# Patient Record
Sex: Female | Born: 1965
Health system: Southern US, Community
[De-identification: ages and names within clinical notes are randomized; demographics above are authoritative.]

## PROBLEM LIST (undated history)

## (undated) DIAGNOSIS — D649 Anemia, unspecified: Secondary | ICD-10-CM

## (undated) DIAGNOSIS — Z8619 Personal history of other infectious and parasitic diseases: Secondary | ICD-10-CM

## (undated) HISTORY — DX: Anemia, unspecified: D64.9

## (undated) HISTORY — DX: Personal history of other infectious and parasitic diseases: Z86.19

---

## 1985-07-22 HISTORY — PX: DILATION AND CURETTAGE OF UTERUS: SHX78

## 1993-07-22 HISTORY — PX: OTHER SURGICAL HISTORY: SHX169

## 1998-08-11 ENCOUNTER — Ambulatory Visit (HOSPITAL_COMMUNITY): Admission: RE | Admit: 1998-08-11 | Discharge: 1998-08-11 | Payer: Self-pay | Admitting: General Surgery

## 1998-08-11 ENCOUNTER — Encounter: Payer: Self-pay | Admitting: General Surgery

## 2000-01-10 ENCOUNTER — Other Ambulatory Visit: Admission: RE | Admit: 2000-01-10 | Discharge: 2000-01-10 | Payer: Self-pay | Admitting: Obstetrics & Gynecology

## 2008-05-11 ENCOUNTER — Ambulatory Visit (HOSPITAL_BASED_OUTPATIENT_CLINIC_OR_DEPARTMENT_OTHER): Admission: RE | Admit: 2008-05-11 | Discharge: 2008-05-11 | Payer: Self-pay | Admitting: Nephrology

## 2008-05-14 ENCOUNTER — Ambulatory Visit: Payer: Self-pay | Admitting: Internal Medicine

## 2010-04-25 ENCOUNTER — Emergency Department (HOSPITAL_COMMUNITY): Admission: EM | Admit: 2010-04-25 | Discharge: 2010-04-25 | Payer: Self-pay | Admitting: Emergency Medicine

## 2010-10-04 LAB — POCT I-STAT, CHEM 8
BUN: 14 mg/dL (ref 6–23)
Calcium, Ion: 1.11 mmol/L — ABNORMAL LOW (ref 1.12–1.32)
Chloride: 107 mEq/L (ref 96–112)
Creatinine, Ser: 0.8 mg/dL (ref 0.4–1.2)
Glucose, Bld: 92 mg/dL (ref 70–99)
HCT: 33 % — ABNORMAL LOW (ref 36.0–46.0)
Hemoglobin: 11.2 g/dL — ABNORMAL LOW (ref 12.0–15.0)
Potassium: 3.9 mEq/L (ref 3.5–5.1)
Sodium: 139 mEq/L (ref 135–145)
TCO2: 23 mmol/L (ref 0–100)

## 2010-10-04 LAB — POCT CARDIAC MARKERS
CKMB, poc: <1 ng/mL — ABNORMAL LOW (ref 1.0–8.0)
Myoglobin, poc: 63.7 ng/mL (ref 12–200)
Troponin i, poc: <0.05 ng/mL (ref 0.00–0.09)

## 2010-12-04 NOTE — Procedures (Signed)
[name], Lorraine NO.:  [id number]   MEDICAL RECORD NO.:  [id number]          PATIENT TYPE:  OUT   LOCATION:  SLEEP CENTER                 FACILITY:  [facility]   PHYSICIAN:  Clinton D. Maple Hudson, MD, FCCP, FACPDATE OF BIRTH:  09-May-1966   DATE OF STUDY:  05/11/2008                            NOCTURNAL POLYSOMNOGRAM   REFERRING PHYSICIAN:  Jarome Matin, M.D.   INDICATION FOR STUDY:  Hypersomnia with sleep apnea.   EPWORTH SLEEPINESS SCORE:  Epworth sleepiness score 15/24.  BMI 36.5.  Weight 240 pounds.  Height 68 inches.  Neck 16 inches.   HOME MEDICATIONS:  None reported.   SLEEP ARCHITECTURE:  Total sleep time 413 minutes with sleep efficiency  91.9%.  Stage I was 4%.  Stage II 74.6%.  Stage III absent.  REM 21.4%  of total sleep time.  Sleep latency 9 minutes.  REM latency 98 minutes.  Awake after sleep onset 27 minutes.  Arousal index 5.7.  No bedtime  medication was taken.   RESPIRATORY DATA:  Apnea-hypopnea index (AHI) 4.5 per hour.  A total of  31 events were scored including 3 obstructive apneas and 28 hypopneas.  All events were counted while sleeping nonsupine.  REM AHI 18.3 per  hour.   OXYGEN DATA:  Moderate snoring with oxygen desaturation to a nadir of  84%.  Mean oxygen saturation through the study was 97.1% on room air.   CARDIAC DATA:  Sinus rhythm with occasional PVC.   MOVEMENT/PARASOMNIA:  No significant movement disorder.  Bathroom x1.   IMPRESSIONS/RECOMMENDATIONS:  1. Unremarkable sleep architecture for sleep center environment with      no bedtime medication taken.  2. Occasional respiratory events with sleep disturbance, AHI 4.5 per      hour (normal range 0-5 per hour).  These events were mostly      associated with nonsupine sleep, moderate snoring with oxygen      desaturation to a nadir of      84%.  3. Consider encouragement to sleep on flat of back.  Discuss sleep      habits and environment.      Clinton D. Maple Hudson,  MD, [facility], FACP  Diplomate, Biomedical engineer of Sleep Medicine  Electronically Signed     CDY/MEDQ  D:  05/14/2008 12:13:03  T:  05/15/2008 02:13:44  Job:  [id number]

## 2011-11-01 ENCOUNTER — Ambulatory Visit: Payer: Self-pay | Admitting: Internal Medicine

## 2011-11-01 DIAGNOSIS — Z0289 Encounter for other administrative examinations: Secondary | ICD-10-CM

## 2012-01-10 ENCOUNTER — Other Ambulatory Visit: Payer: Self-pay | Admitting: Internal Medicine

## 2012-01-10 DIAGNOSIS — Z1231 Encounter for screening mammogram for malignant neoplasm of breast: Secondary | ICD-10-CM

## 2012-01-20 ENCOUNTER — Ambulatory Visit: Payer: Self-pay

## 2012-01-21 ENCOUNTER — Ambulatory Visit
Admission: RE | Admit: 2012-01-21 | Discharge: 2012-01-21 | Disposition: A | Discharge status: Home / Self Care | Payer: BC Managed Care – PPO | Source: Ambulatory Visit | Attending: Internal Medicine | Admitting: Internal Medicine

## 2012-01-21 DIAGNOSIS — Z1231 Encounter for screening mammogram for malignant neoplasm of breast: Secondary | ICD-10-CM

## 2012-02-06 ENCOUNTER — Encounter: Payer: Self-pay | Admitting: Obstetrics and Gynecology

## 2012-02-17 ENCOUNTER — Encounter: Payer: Self-pay | Admitting: Obstetrics and Gynecology

## 2012-02-17 ENCOUNTER — Ambulatory Visit (INDEPENDENT_AMBULATORY_CARE_PROVIDER_SITE_OTHER): Payer: BC Managed Care – PPO | Admitting: Obstetrics and Gynecology

## 2012-02-17 VITALS — BP 118/74 | HR 80 | Ht 67.0 in | Wt 246.0 lb

## 2012-02-17 DIAGNOSIS — Z124 Encounter for screening for malignant neoplasm of cervix: Secondary | ICD-10-CM

## 2012-02-17 DIAGNOSIS — Z01419 Encounter for gynecological examination (general) (routine) without abnormal findings: Secondary | ICD-10-CM

## 2012-02-17 DIAGNOSIS — Z113 Encounter for screening for infections with a predominantly sexual mode of transmission: Secondary | ICD-10-CM

## 2012-02-17 LAB — HEPATITIS C ANTIBODY: HCV Ab: NEGATIVE

## 2012-02-17 LAB — HEPATITIS B SURFACE ANTIGEN: Hepatitis B Surface Ag: NEGATIVE

## 2012-02-17 NOTE — Progress Notes (Signed)
Subjective:    Lorraine Evans is a 46 y.o. female, [id number], who presents for an annual exam. The patient has no complaints.  Menstrual cycle:   LMP: Patient's last menstrual period was 01/27/2012. Flow 7 days, change pad 4 times a day, moderate cramping (does not require analgesia). Patient not  using birth control and does not want to use anything.             Review of Systems Pertinent items are noted in HPI. Denies pelvic pain, urinary tract symptoms, vaginitis symptoms, irregular bleeding, menopausal symptoms, change in bowel habits or rectal bleeding   Objective:    BP 118/74  Pulse 80  Ht 5\' 7"  (1.702 m)  Wt 246 lb (111.585 kg)  BMI 38.53 kg/m2  LMP 01/27/2012   Wt Readings from Last 1 Encounters:  02/17/12 246 lb (111.585 kg)   Body mass index is 38.53 kg/(m^2). General Appearance: Alert, no acute distress HEENT: Grossly normal Neck / Thyroid: Supple, no thyromegaly or cervical adenopathy Lungs: Clear to auscultation bilaterally Back: No CVA tenderness Breast Exam: No masses or nodes.No dimpling, nipple retraction or discharge. Cardiovascular: Regular rate and rhythm.  Gastrointestinal: Soft, non-tender, no masses or organomegaly Pelvic Exam: EGBUS-wnl, vagina-normal rugae, cervix- without lesions or tenderness, uterus appears upper limits of normal size (exam limited by habitus), adnexae-no masses or tenderness Rectovaginal: no masses and normal sphincter tone Lymphatic Exam: Non-palpable nodes in neck, clavicular,  axillary, or inguinal regions  Skin: no rashes or abnormalities Extremities: no clubbing cyanosis or edema  Neurologic: grossly normal Psychiatric: Alert and oriented   Assessment:   Routine GYN Exam Declines Contraception   Plan:  Continue MVI with folic acid 400 mcg  PAP sent  STD testing  RTO 1 year or prn  Tylena Prisk,ELMIRAPA-C

## 2012-02-17 NOTE — Progress Notes (Signed)
Regular Periods: yes Mammogram: yes  Monthly Breast Ex.: no Exercise: yes  Tetanus < 10 years: yes Seatbelts: yes  NI. Bladder Functn.: yes Abuse at home: no  Daily BM's: yes Stressful Work: no  Healthy Diet: no Sigmoid-Colonoscopy: no  Calcium: no Medical problems this year: no problems   LAST ZOX:WRUE time  Contraception: none  Mammogram:  7/13  nl  PCP: DR. SANDERS  PMH: NO CHANGE  FMH: NO CHANGE  Last Bone Scan: NO

## 2012-02-18 LAB — HSV 2 ANTIBODY, IGG: HSV 2 Glycoprotein G Ab, IgG: 13.9 IV — ABNORMAL HIGH

## 2012-02-19 LAB — PAP IG, CT-NG, RFX HPV ASCU: GC Probe Amp: NEGATIVE

## 2014-02-28 ENCOUNTER — Other Ambulatory Visit: Payer: Self-pay

## 2014-02-28 DIAGNOSIS — Z1231 Encounter for screening mammogram for malignant neoplasm of breast: Secondary | ICD-10-CM

## 2014-03-07 ENCOUNTER — Ambulatory Visit: Payer: BC Managed Care – PPO

## 2014-03-10 ENCOUNTER — Ambulatory Visit
Admission: RE | Admit: 2014-03-10 | Discharge: 2014-03-10 | Disposition: A | Discharge status: Home / Self Care | Payer: BC Managed Care – PPO | Source: Ambulatory Visit

## 2014-03-10 DIAGNOSIS — Z1231 Encounter for screening mammogram for malignant neoplasm of breast: Secondary | ICD-10-CM

## 2014-03-25 LAB — HM PAP SMEAR: HPV, high-risk: NEGATIVE

## 2015-04-21 ENCOUNTER — Other Ambulatory Visit: Payer: Self-pay

## 2015-04-21 DIAGNOSIS — Z1231 Encounter for screening mammogram for malignant neoplasm of breast: Secondary | ICD-10-CM

## 2015-04-24 ENCOUNTER — Ambulatory Visit: Payer: Self-pay

## 2015-04-27 ENCOUNTER — Ambulatory Visit
Admission: RE | Admit: 2015-04-27 | Discharge: 2015-04-27 | Disposition: A | Discharge status: Home / Self Care | Payer: 59 | Source: Ambulatory Visit

## 2015-04-27 DIAGNOSIS — Z1231 Encounter for screening mammogram for malignant neoplasm of breast: Secondary | ICD-10-CM

## 2016-05-07 ENCOUNTER — Other Ambulatory Visit: Payer: Self-pay | Admitting: Internal Medicine

## 2016-05-07 DIAGNOSIS — Z1231 Encounter for screening mammogram for malignant neoplasm of breast: Secondary | ICD-10-CM

## 2016-05-21 ENCOUNTER — Ambulatory Visit: Payer: 59

## 2016-06-26 ENCOUNTER — Ambulatory Visit
Admission: RE | Admit: 2016-06-26 | Discharge: 2016-06-26 | Disposition: A | Discharge status: Home / Self Care | Payer: 59 | Source: Ambulatory Visit | Attending: Internal Medicine | Admitting: Internal Medicine

## 2016-06-26 DIAGNOSIS — Z1231 Encounter for screening mammogram for malignant neoplasm of breast: Secondary | ICD-10-CM

## 2016-09-30 ENCOUNTER — Encounter: Payer: Self-pay | Admitting: Obstetrics and Gynecology

## 2017-06-16 ENCOUNTER — Other Ambulatory Visit: Payer: Self-pay | Admitting: Internal Medicine

## 2017-06-16 DIAGNOSIS — Z1231 Encounter for screening mammogram for malignant neoplasm of breast: Secondary | ICD-10-CM

## 2017-07-21 ENCOUNTER — Ambulatory Visit: Payer: 59

## 2017-08-25 ENCOUNTER — Ambulatory Visit: Payer: Self-pay

## 2017-09-02 ENCOUNTER — Ambulatory Visit
Admission: RE | Admit: 2017-09-02 | Discharge: 2017-09-02 | Disposition: A | Discharge status: Home / Self Care | Payer: PRIVATE HEALTH INSURANCE | Source: Ambulatory Visit | Attending: Internal Medicine | Admitting: Internal Medicine

## 2017-09-02 DIAGNOSIS — Z1231 Encounter for screening mammogram for malignant neoplasm of breast: Secondary | ICD-10-CM

## 2017-09-03 ENCOUNTER — Other Ambulatory Visit: Payer: Self-pay | Admitting: Internal Medicine

## 2017-09-03 DIAGNOSIS — R928 Other abnormal and inconclusive findings on diagnostic imaging of breast: Secondary | ICD-10-CM

## 2017-09-15 ENCOUNTER — Other Ambulatory Visit: Payer: Self-pay | Admitting: Internal Medicine

## 2017-09-15 DIAGNOSIS — R928 Other abnormal and inconclusive findings on diagnostic imaging of breast: Secondary | ICD-10-CM

## 2017-09-18 ENCOUNTER — Ambulatory Visit
Admission: RE | Admit: 2017-09-18 | Discharge: 2017-09-18 | Disposition: A | Discharge status: Home / Self Care | Payer: PRIVATE HEALTH INSURANCE | Source: Ambulatory Visit | Attending: Internal Medicine | Admitting: Internal Medicine

## 2017-09-18 DIAGNOSIS — R928 Other abnormal and inconclusive findings on diagnostic imaging of breast: Secondary | ICD-10-CM

## 2017-10-01 ENCOUNTER — Other Ambulatory Visit: Payer: Self-pay | Admitting: Obstetrics and Gynecology

## 2017-10-01 ENCOUNTER — Other Ambulatory Visit (HOSPITAL_COMMUNITY)
Admission: RE | Admit: 2017-10-01 | Discharge: 2017-10-01 | Disposition: A | Discharge status: Home / Self Care | Payer: BLUE CROSS/BLUE SHIELD | Source: Ambulatory Visit | Attending: Obstetrics and Gynecology | Admitting: Obstetrics and Gynecology

## 2017-10-01 DIAGNOSIS — Z124 Encounter for screening for malignant neoplasm of cervix: Secondary | ICD-10-CM | POA: Insufficient documentation

## 2017-10-06 LAB — CYTOLOGY - PAP
Adequacy: ABSENT
Diagnosis: NEGATIVE
HPV: NOT DETECTED

## 2017-10-16 ENCOUNTER — Other Ambulatory Visit: Payer: Self-pay | Admitting: Obstetrics and Gynecology

## 2017-11-20 NOTE — Patient Instructions (Addendum)
Your procedure is scheduled on: Wednesday Dec 03, 2017 at 8:30 am  Enter through the Main Entrance of [name] at: 7:00 am  Pick up the phone at the desk and dial [phone].  Call this number if you have problems the morning of surgery: [phone].  Remember: Do NOT eat food or drink any liquids after: Midnight on Tuesday May 14 D Take these medicines the morning of surgery with a SIP OF WATER: None  Stop all vitamins, herbal medications and supplement now  DO NOT SMOKE DAY OF SURGERY   Do NOT wear jewelry (body piercing), metal hair clips/bobby pins, make-up, or nail polish. Do NOT wear lotions, powders, or perfumes.  You may wear deoderant. Do NOT shave for 48 hours prior to surgery. Do NOT bring valuables to the hospital. Contacts, dentures, or bridgework may not be worn into surgery.  Have a responsible adult drive you home and stay with you for 24 hours after your procedureYour procedure is scheduled on:

## 2017-11-21 ENCOUNTER — Encounter (HOSPITAL_COMMUNITY): Payer: Self-pay

## 2017-11-21 ENCOUNTER — Other Ambulatory Visit: Payer: Self-pay

## 2017-11-21 ENCOUNTER — Encounter (HOSPITAL_COMMUNITY)
Admission: RE | Admit: 2017-11-21 | Discharge: 2017-11-21 | Disposition: A | Discharge status: Home / Self Care | Payer: BLUE CROSS/BLUE SHIELD | Source: Ambulatory Visit | Attending: Obstetrics and Gynecology | Admitting: Obstetrics and Gynecology

## 2017-11-21 DIAGNOSIS — Z01812 Encounter for preprocedural laboratory examination: Secondary | ICD-10-CM | POA: Diagnosis not present

## 2017-11-21 LAB — CBC
HEMATOCRIT: 30.9 % — AB (ref 36.0–46.0)
HEMOGLOBIN: 10.8 g/dL — AB (ref 12.0–15.0)
MCH: 28.3 pg (ref 26.0–34.0)
MCHC: 35 g/dL (ref 30.0–36.0)
MCV: 80.9 fL (ref 78.0–100.0)
Platelets: 270 10*3/uL (ref 150–400)
RBC: 3.82 MIL/uL — AB (ref 3.87–5.11)
RDW: 15.4 % (ref 11.5–15.5)
WBC: 8.3 10*3/uL (ref 4.0–10.5)

## 2017-12-01 NOTE — Anesthesia Preprocedure Evaluation (Addendum)
Anesthesia Evaluation  Patient identified by MRN, date of birth, ID band Patient awake    Reviewed: Allergy & Precautions, NPO status , Patient's Chart, lab work & pertinent test results  Airway Mallampati: III  TM Distance: >3 FB Neck ROM: Full    Dental no notable dental hx. (+) Teeth Intact, Dental Advisory Given   Pulmonary neg pulmonary ROS,    Pulmonary exam normal breath sounds clear to auscultation       Cardiovascular negative cardio ROS Normal cardiovascular exam Rhythm:Regular Rate:Normal     Neuro/Psych negative neurological ROS  negative psych ROS   GI/Hepatic negative GI ROS, Neg liver ROS,   Endo/Other  negative endocrine ROSMorbid obesity  Renal/GU negative Renal ROS  negative genitourinary   Musculoskeletal negative musculoskeletal ROS (+)   Abdominal (+) + obese,   Peds negative pediatric ROS (+)  Hematology negative hematology ROS (+) anemia ,   Anesthesia Other Findings   Reproductive/Obstetrics negative OB ROS                            Lab Results  Component Value Date   WBC 8.3 11/21/2017   HGB 10.8 (L) 11/21/2017   HCT 30.9 (L) 11/21/2017   MCV 80.9 11/21/2017   PLT 270 11/21/2017    Anesthesia Physical Anesthesia Plan  ASA: II  Anesthesia Plan: General   Post-op Pain Management:    Induction: Intravenous  PONV Risk Score and Plan: 3 and Treatment may vary due to age or medical condition, Ondansetron, Dexamethasone and Scopolamine patch - Pre-op  Airway Management Planned: LMA  Additional Equipment:   Intra-op Plan:   Post-operative Plan:   Informed Consent: I have reviewed the patients History and Physical, chart, labs and discussed the procedure including the risks, benefits and alternatives for the proposed anesthesia with the patient or authorized representative who has indicated his/her understanding and acceptance.     Plan Discussed  with: CRNA  Anesthesia Plan Comments:        Anesthesia Quick Evaluation

## 2017-12-03 ENCOUNTER — Ambulatory Visit (HOSPITAL_COMMUNITY)
Admission: AD | Admit: 2017-12-03 | Discharge: 2017-12-03 | Disposition: A | Discharge status: Home / Self Care | Payer: BLUE CROSS/BLUE SHIELD | Source: Ambulatory Visit | Attending: Obstetrics and Gynecology | Admitting: Obstetrics and Gynecology

## 2017-12-03 ENCOUNTER — Ambulatory Visit (HOSPITAL_COMMUNITY): Payer: BLUE CROSS/BLUE SHIELD | Admitting: Anesthesiology

## 2017-12-03 ENCOUNTER — Encounter (HOSPITAL_COMMUNITY)
Admission: AD | Disposition: A | Discharge status: Home / Self Care | Payer: Self-pay | Source: Ambulatory Visit | Attending: Obstetrics and Gynecology

## 2017-12-03 ENCOUNTER — Other Ambulatory Visit: Payer: Self-pay

## 2017-12-03 ENCOUNTER — Encounter (HOSPITAL_COMMUNITY): Payer: Self-pay | Admitting: Anesthesiology

## 2017-12-03 DIAGNOSIS — Z6839 Body mass index (BMI) 39.0-39.9, adult: Secondary | ICD-10-CM | POA: Insufficient documentation

## 2017-12-03 DIAGNOSIS — N920 Excessive and frequent menstruation with regular cycle: Secondary | ICD-10-CM | POA: Diagnosis present

## 2017-12-03 DIAGNOSIS — N840 Polyp of corpus uteri: Secondary | ICD-10-CM | POA: Diagnosis not present

## 2017-12-03 HISTORY — PX: DILITATION & CURRETTAGE/HYSTROSCOPY WITH HYDROTHERMAL ABLATION: SHX5570

## 2017-12-03 LAB — PREGNANCY, URINE: Preg Test, Ur: NEGATIVE

## 2017-12-03 SURGERY — DILATATION & CURETTAGE/HYSTEROSCOPY WITH HYDROTHERMAL ABLATION
Anesthesia: General | Site: Vagina

## 2017-12-03 MED ORDER — MIDAZOLAM HCL 2 MG/2ML IJ SOLN
INTRAMUSCULAR | Status: AC
  Filled 2017-12-03: qty 2

## 2017-12-03 MED ORDER — FENTANYL CITRATE (PF) 250 MCG/5ML IJ SOLN
INTRAMUSCULAR | Status: DC | PRN
  Administered 2017-12-03: 100 ug via INTRAVENOUS
  Administered 2017-12-03: 50 ug via INTRAVENOUS

## 2017-12-03 MED ORDER — PROPOFOL 10 MG/ML IV BOLUS
INTRAVENOUS | Status: AC
  Filled 2017-12-03: qty 40

## 2017-12-03 MED ORDER — SODIUM CHLORIDE 0.9 % IR SOLN
Status: DC | PRN
  Administered 2017-12-03: 3000 mL

## 2017-12-03 MED ORDER — FENTANYL CITRATE (PF) 100 MCG/2ML IJ SOLN
INTRAMUSCULAR | Status: AC
  Filled 2017-12-03: qty 4

## 2017-12-03 MED ORDER — IBUPROFEN 600 MG PO TABS: 600.0000 mg | ORAL_TABLET | Freq: Four times a day (QID) | ORAL | 0 refills | Status: DC | PRN

## 2017-12-03 MED ORDER — GLYCOPYRROLATE 0.2 MG/ML IJ SOLN
INTRAMUSCULAR | Status: AC
  Filled 2017-12-03: qty 1

## 2017-12-03 MED ORDER — ACETAMINOPHEN 10 MG/ML IV SOLN: 1000.0000 mg | Freq: Once | INTRAVENOUS | Status: DC | PRN

## 2017-12-03 MED ORDER — LIDOCAINE HCL (CARDIAC) PF 100 MG/5ML IV SOSY
PREFILLED_SYRINGE | INTRAVENOUS | Status: AC
  Filled 2017-12-03: qty 5

## 2017-12-03 MED ORDER — HYDROCODONE-ACETAMINOPHEN 7.5-325 MG PO TABS: 1.0000 | ORAL_TABLET | Freq: Once | ORAL | Status: DC | PRN

## 2017-12-03 MED ORDER — PROPOFOL 10 MG/ML IV BOLUS
INTRAVENOUS | Status: DC | PRN
  Administered 2017-12-03: 160 mg via INTRAVENOUS

## 2017-12-03 MED ORDER — SODIUM CHLORIDE 0.9 % IJ SOLN
INTRAMUSCULAR | Status: AC
  Filled 2017-12-03: qty 100

## 2017-12-03 MED ORDER — VASOPRESSIN 20 UNIT/ML IV SOLN
INTRAVENOUS | Status: DC | PRN
  Administered 2017-12-03: 10 mL via INTRAMUSCULAR

## 2017-12-03 MED ORDER — PROMETHAZINE HCL 25 MG/ML IJ SOLN: 6.2500 mg | INTRAMUSCULAR | Status: DC | PRN

## 2017-12-03 MED ORDER — LIDOCAINE HCL 2 % IJ SOLN
INTRAMUSCULAR | Status: DC | PRN
  Administered 2017-12-03: 10 mL

## 2017-12-03 MED ORDER — LACTATED RINGERS IV SOLN
INTRAVENOUS | Status: DC
  Administered 2017-12-03 (×2): via INTRAVENOUS

## 2017-12-03 MED ORDER — ONDANSETRON HCL 4 MG/2ML IJ SOLN
INTRAMUSCULAR | Status: AC
  Filled 2017-12-03: qty 2

## 2017-12-03 MED ORDER — GABAPENTIN 300 MG PO CAPS
ORAL_CAPSULE | ORAL | Status: AC
  Administered 2017-12-03: 300 mg via ORAL
  Filled 2017-12-03: qty 1

## 2017-12-03 MED ORDER — KETOROLAC TROMETHAMINE 30 MG/ML IJ SOLN
INTRAMUSCULAR | Status: DC | PRN
  Administered 2017-12-03: 30 mg via INTRAVENOUS

## 2017-12-03 MED ORDER — ONDANSETRON HCL 4 MG/2ML IJ SOLN
INTRAMUSCULAR | Status: DC | PRN
  Administered 2017-12-03: 4 mg via INTRAVENOUS

## 2017-12-03 MED ORDER — GABAPENTIN 300 MG PO CAPS
300.0000 mg | ORAL_CAPSULE | Freq: Once | ORAL | Status: AC
  Administered 2017-12-03: 300 mg via ORAL

## 2017-12-03 MED ORDER — DEXAMETHASONE SODIUM PHOSPHATE 10 MG/ML IJ SOLN
INTRAMUSCULAR | Status: AC
  Filled 2017-12-03: qty 1

## 2017-12-03 MED ORDER — ACETAMINOPHEN 500 MG PO TABS
1000.0000 mg | ORAL_TABLET | Freq: Once | ORAL | Status: AC
  Administered 2017-12-03: 1000 mg via ORAL

## 2017-12-03 MED ORDER — DEXAMETHASONE SODIUM PHOSPHATE 4 MG/ML IJ SOLN
INTRAMUSCULAR | Status: DC | PRN
  Administered 2017-12-03: 10 mg via INTRAVENOUS

## 2017-12-03 MED ORDER — SCOPOLAMINE 1 MG/3DAYS TD PT72
1.0000 | MEDICATED_PATCH | Freq: Once | TRANSDERMAL | Status: DC
  Administered 2017-12-03: 1.5 mg via TRANSDERMAL

## 2017-12-03 MED ORDER — ACETAMINOPHEN 500 MG PO TABS
ORAL_TABLET | ORAL | Status: AC
  Administered 2017-12-03: 1000 mg via ORAL
  Filled 2017-12-03: qty 2

## 2017-12-03 MED ORDER — HYDROMORPHONE HCL 1 MG/ML IJ SOLN: 0.2500 mg | INTRAMUSCULAR | Status: DC | PRN

## 2017-12-03 MED ORDER — MIDAZOLAM HCL 2 MG/2ML IJ SOLN
INTRAMUSCULAR | Status: DC | PRN
  Administered 2017-12-03: 1 mg via INTRAVENOUS

## 2017-12-03 MED ORDER — VASOPRESSIN 20 UNIT/ML IV SOLN
INTRAVENOUS | Status: AC
  Filled 2017-12-03: qty 1

## 2017-12-03 MED ORDER — LIDOCAINE HCL 2 % IJ SOLN
INTRAMUSCULAR | Status: AC
  Filled 2017-12-03: qty 20

## 2017-12-03 MED ORDER — GLYCOPYRROLATE 0.2 MG/ML IJ SOLN
INTRAMUSCULAR | Status: DC | PRN
  Administered 2017-12-03: .05 mg via INTRAVENOUS

## 2017-12-03 MED ORDER — LIDOCAINE HCL (CARDIAC) PF 100 MG/5ML IV SOSY
PREFILLED_SYRINGE | INTRAVENOUS | Status: DC | PRN
  Administered 2017-12-03: 100 mg via INTRAVENOUS

## 2017-12-03 MED ORDER — MEPERIDINE HCL 25 MG/ML IJ SOLN: 6.2500 mg | INTRAMUSCULAR | Status: DC | PRN

## 2017-12-03 MED ORDER — SCOPOLAMINE 1 MG/3DAYS TD PT72
MEDICATED_PATCH | TRANSDERMAL | Status: AC
  Administered 2017-12-03: 1.5 mg via TRANSDERMAL
  Filled 2017-12-03: qty 1

## 2017-12-03 SURGICAL SUPPLY — 11 items
CANISTER SUCT 3000ML PPV (MISCELLANEOUS) ×2 IMPLANT
CATH ROBINSON RED A/P 16FR (CATHETERS) ×2 IMPLANT
DILATOR CANAL MILEX (MISCELLANEOUS) ×2 IMPLANT
GLOVE BIO SURGEON STRL SZ7 (GLOVE) ×2 IMPLANT
GLOVE BIOGEL PI IND STRL 7.0 (GLOVE) ×2 IMPLANT
GLOVE BIOGEL PI INDICATOR 7.0 (GLOVE) ×2
GOWN STRL REUS W/TWL LRG LVL3 (GOWN DISPOSABLE) ×4 IMPLANT
PACK VAGINAL MINOR WOMEN LF (CUSTOM PROCEDURE TRAY) ×2 IMPLANT
PAD OB MATERNITY 4.3X12.25 (PERSONAL CARE ITEMS) ×2 IMPLANT
SET GENESYS HTA PROCERVA (MISCELLANEOUS) ×2 IMPLANT
TOWEL OR 17X24 6PK STRL BLUE (TOWEL DISPOSABLE) ×4 IMPLANT

## 2017-12-03 NOTE — Op Note (Signed)
NAME: Lorraine Evans, Lorraine Evans MEDICAL RECORD [id number] ACCOUNT [id number] DATE OF BIRTH:[birth year] FACILITY: [facility] LOCATION: [facility] PHYSICIAN:Namine Beahm Derrell Lolling, MD  OPERATIVE REPORT  DATE OF PROCEDURE:  12/03/2017  PREOPERATIVE DIAGNOSIS:  Menorrhagia with irregular cycle and fibroids.  POSTOPERATIVE DIAGNOSIS:  Menorrhagia with irregular cycle and fibroids.  PROCEDURE:  Hysteroscopy, dilatation and curettage with hydrothermal ablation (HTA).  SURGEON:   Geryl Rankins, MD  ASSISTANT:  Technician.  ANESTHESIA:  Local and general.  ESTIMATED BLOOD LOSS:  15 mL.  BLOOD ADMINISTERED:  None.  DRAINS:  None.    LOCAL: Lidocaine, 2%, 10 mL and vasopressin 20 and 110 mL.  SPECIMEN:  Endometrial curettings.  DISPOSITION OF SPECIMEN:  To pathology.    PLAN OF CARE:  Discharged to home after PACU.  PATIENT DISPOSITION:  To PACU hemodynamically stable.  COMPLICATIONS:  None.  FINDINGS:  Misshapen endometrial cavity with crypts concerning for adenomyosis.  Smooth thin atrophic appearing endometrium.  Normal endocervical canal.  DESCRIPTION OF PROCEDURE:  The patient was identified in the holding area.  She was then taken to the operating room with IV running.  She underwent general anesthesia without complication, placed in the dorsal lithotomy position, prepped and draped in a  normal sterile fashion.  SCDs were on her legs and operating.  She did not have IV antibiotics prior to procedure.  Timeout was performed.  A Graves speculum was inserted into the vagina, but due to a large rectocele, it was difficult to visualize the cervix.  A short weighted speculum was inserted and then a long weighted, but due to the bulging and the placement of her cervix, the shorter  weighted speculum was passed.  I did do a bimanual exam and the uterus felt retroverted.  She was known to have a large anterior fibroid by ultrasound prior to the procedure.  Her cervix was displaced to the patient's  left in the upper left fornix.   There was some discharge noted, so I did a second prep of the vagina and cervix.  Os finder was used.  Cervix was dilated easily up to a 6.  The HTA hysteroscope was then advanced through the cervix and into the endometrial canal and the findings above were noted.  There were no fibroids in the endometrium that I could resect.  The  HTA device was started and it detected leakage.  The seal was tight, however, I injected vasopressin at 20 units.  We lowered the machine and then I put a second tenaculum on the posterior portion of the cervix.  We initiated ablation again and it went  out without any alarms or troubleshooting needed.  At the end of the ablation, the cavity appeared to be less misshapen and an adequate ablation was noted throughout the entire endometrial cavity.  The hysteroscope was then removed.  I then did a curettage of the endometrial canal of the endometrium without complication.  All instruments were removed from the cervix and the vagina.  No bleeding was noted.  The patient got Toradol at the end of the  case.  All instrument, sponge and needle counts were correct x3.  The patient tolerated the procedure well.  She was taken to the recovery room in stable condition.  AN/NUANCE  D:12/03/2017 T:12/03/2017 JOB:[id number]

## 2017-12-03 NOTE — Brief Op Note (Signed)
12/03/2017  9:37 AM  PATIENT:  Lorraine Evans  52 y.o. female  PRE-OPERATIVE DIAGNOSIS:  N92.1 Menorrhagia with irregular cycle, Fibroids  POST-OPERATIVE DIAGNOSIS:    PROCEDURE:  Procedure(s): DILATATION & CURETTAGE/HYSTEROSCOPY WITH HYDROTHERMAL ABLATION (N/A)  SURGEON:  Surgeon(s) and Role:    Geryl Rankins, MD - Primary  PHYSICIAN ASSISTANT:   ASSISTANTS: Technician   ANESTHESIA:   local and general  EBL:  15 mL   BLOOD ADMINISTERED:none  DRAINS: none   LOCAL MEDICATIONS USED:  2% LIDOCAINE , Amount: 10 ml and OTHER VASOPRESSIN 20/100 10 ML  SPECIMEN:  Source of Specimen:  Endometrial currettings  DISPOSITION OF SPECIMEN:  PATHOLOGY  COUNTS:  YES  TOURNIQUET:  * No tourniquets in log *  DICTATION: .Other Dictation: Dictation Number  [id number]  PLAN OF CARE: Discharge to home after PACU  PATIENT DISPOSITION:  PACU - hemodynamically stable.   Delay start of Pharmacological VTE agent (>24hrs) due to surgical blood loss or risk of bleeding: yes

## 2017-12-03 NOTE — Anesthesia Postprocedure Evaluation (Signed)
Anesthesia Post Note  Patient: Lorraine Evans  Procedure(s) Performed: DILATATION & CURETTAGE/HYSTEROSCOPY WITH HYDROTHERMAL ABLATION (N/A Vagina )     Patient location during evaluation: PACU Anesthesia Type: General Level of consciousness: awake and alert Pain management: pain level controlled Vital Signs Assessment: post-procedure vital signs reviewed and stable Respiratory status: spontaneous breathing, nonlabored ventilation, respiratory function stable and patient connected to nasal cannula oxygen Cardiovascular status: blood pressure returned to baseline and stable Postop Assessment: no apparent nausea or vomiting Anesthetic complications: no    Last Vitals:  Vitals:   12/03/17 1030 12/03/17 1110  BP: 140/83 135/79  Pulse: 64 73  Resp: 19 18  Temp: 36.5 C 36.8 C  SpO2: 100% 99%    Last Pain:  Vitals:   12/03/17 1110  TempSrc:   PainSc: 2    Pain Goal: Patients Stated Pain Goal: 3 (12/03/17 1110)               Trevor Iha

## 2017-12-03 NOTE — Discharge Instructions (Addendum)
Dilation and Curettage or Vacuum Curettage, Care After This sheet gives you information about how to care for yourself after your procedure. Your health care provider may also give you more specific instructions. If you have problems or questions, contact your health care provider. What can I expect after the procedure? After your procedure, it is common to have:  Mild pain or cramping.  Some vaginal bleeding or spotting.  These may last for up to 2 weeks after your procedure. Follow these instructions at home: Activity   Do not drive or use heavy machinery while taking prescription pain medicine.  Avoid driving for the first 24 hours after your procedure.  Take frequent, short walks, followed by rest periods, throughout the day. Ask your health care provider what activities are safe for you. After 1-2 days, you may be able to return to your normal activities.  Do not lift anything heavier than 10 lb (4.5 kg) until your health care provider approves.  For at least 2 weeks, or as long as told by your health care provider, do not: ? Douche. ? Use tampons. ? Have sexual intercourse. General instructions   Take over-the-counter and prescription medicines only as told by your health care provider. This is especially important if you take blood thinning medicine.  Do not take baths, swim, or use a hot tub until your health care provider approves. Take showers instead of baths.  Wear compression stockings as told by your health care provider. These stockings help to prevent blood clots and reduce swelling in your legs.  It is your responsibility to get the results of your procedure. Ask your health care provider, or the department performing the procedure, when your results will be ready.  Keep all follow-up visits as told by your health care provider. This is important. Contact a health care provider if:  You have severe cramps that get worse or that do not get better with  medicine.  You have severe abdominal pain.  You cannot drink fluids without vomiting.  You develop pain in a different area of your pelvis.  You have bad-smelling vaginal discharge.  You have a rash. Get help right away if:  You have vaginal bleeding that soaks more than one sanitary pad in 1 hour, for 2 hours in a row.  You pass large blood clots from your vagina.  You have a fever that is above 100.77F (38.0C).  Your abdomen feels very tender or hard.  You have chest pain.  You have shortness of breath.  You cough up blood.  You feel dizzy or light-headed.  You faint.  You have pain in your neck or shoulder area. This information is not intended to replace advice given to you by your health care provider. Make sure you discuss any questions you have with your health care provider. Document Released: 07/05/2000 Document Revised: 03/06/2016 Document Reviewed: 02/08/2016 Elsevier Interactive Patient Education  2018 Elsevier Inc[employer]   DISCHARGE INSTRUCTIONS: HYSTEROSCOPY / ENDOMETRIAL ABLATION The following instructions have been prepared to help you care for yourself upon your return home.   May take Ibuprofen after 3:30pm 12/03/17  May take stool softner while taking narcotic pain medication to prevent constipation.  Drink plenty of water.  Personal hygiene:  Use sanitary pads for vaginal drainage, not tampons.  Shower the day after your procedure.  NO tub baths, pools or Jacuzzis for 2-3 weeks.  Wipe front to back after using the bathroom.  Activity and limitations:  Do NOT drive or operate  any equipment for 24 hours. The effects of anesthesia are still present and drowsiness may result.  Do NOT rest in bed all day.  Walking is encouraged.  Walk up and down stairs slowly.  You may resume your normal activity in one to two days or as indicated by your physician. Sexual activity: NO intercourse for at least 2 weeks after the procedure, or as indicated  by your Doctor.  Diet: Eat a light meal as desired this evening. You may resume your usual diet tomorrow.  Return to Work: You may resume your work activities in one to two days or as indicated by Therapist, sports.  What to expect after your surgery: Expect to have vaginal bleeding/discharge for 2-3 days and spotting for up to 10 days. It is not unusual to have soreness for up to 1-2 weeks. You may have a slight burning sensation when you urinate for the first day. Mild cramps may continue for a couple of days. You may have a regular period in 2-6 weeks.  Call your doctor for any of the following:  Excessive vaginal bleeding or clotting, saturating and changing one pad every hour.  Inability to urinate 6 hours after discharge from hospital.  Pain not relieved by pain medication.  Fever of 100.4 F or greater.  Unusual vaginal discharge or odor.  Post Anesthesia Home Care Instructions  Activity: Get plenty of rest for the remainder of the day. A responsible individual must stay with you for 24 hours following the procedure.  For the next 24 hours, DO NOT: -Drive a car -Advertising copywriter -Drink alcoholic beverages -Take any medication unless instructed by your physician -Make any legal decisions or sign important papers.  Meals: Start with liquid foods such as gelatin or soup. Progress to regular foods as tolerated. Avoid greasy, spicy, heavy foods. If nausea and/or vomiting occur, drink only clear liquids until the nausea and/or vomiting subsides. Call your physician if vomiting continues.  Special Instructions/Symptoms: Your throat may feel dry or sore from the anesthesia or the breathing tube placed in your throat during surgery. If this causes discomfort, gargle with warm salt water. The discomfort should disappear within 24 hours.  If you had a scopolamine patch placed behind your ear for the management of post- operative nausea and/or vomiting:  1. The medication in the  patch is effective for 72 hours, after which it should be removed.  Wrap patch in a tissue and discard in the trash. Wash hands thoroughly with soap and water. 2. You may remove the patch earlier than 72 hours if you experience unpleasant side effects which may include dry mouth, dizziness or visual disturbances. 3. Avoid touching the patch. Wash your hands with soap and water after contact with the patch.

## 2017-12-03 NOTE — H&P (Signed)
   Reason for Appointment  1. Pre-op   History of Present Illness  General:  Pt presents for preop for Hyst/D&C, HTA ablation, possible Myosure. Pt has menorrhagia and fibroids. Possible adenomyosis. Pt is perimenopausal. She continues to take iron for anemia. Pt desires to proceed with minimally invasive surgery.   Current Medications  Taking   Vitamin D 1000 UNIT Tablet 1 tablet Orally Once a day   Multivitamin Adult - Tablet as directed Orally   Fusion Plus - Capsule 1 capsule Orally Once a day   Medication List reviewed and reconciled with the patient    Past Medical History  Gall Stones.           Surgical History  Gall stones 2003   Family History  Mother: alive  denies any GYN family cancer hx.   Social History  General:  Tobacco use  cigarettes: Never smoked Tobacco history last updated 11/21/2017 no Alcohol.  no Recreational drug use.  Exercise: yes, minimal.  Marital Status: married.  Children: Boys, 1.  OCCUPATION: Supervisor Mining engineer.    Gyn History  Sexual activity not currently sexually active.  Periods : irregular.  LMP 11/10/2017.  Birth control none.  Last pap smear date 10/01/2017 Neg/HPVneg.  Last mammogram date 2 weeks ago 2019.  Denies H/O STD.    OB History  Number of pregnancies 3.  miscarriages 2.  Pregnancy # 1 live birth, C-section delivery.    Allergies  N.K.D.A.   Hospitalization/Major Diagnostic Procedure  None 09/2017   Review of Systems  Denies fever/chills, chest pain, SOB, headaches, numbness/tingling. No h/o complication with anesthesia, bleeding disorders or blood clots.   Vital Signs  Wt 251.2, Wt change -4.5 lb, Ht 67, BMI 39.34, Pulse sitting 84, BP sitting 130/90.   Physical Examination  GENERAL:  Patient appears alert and oriented.  General Appearance: well-appearing, well-developed, no acute distress.  Speech: clear.  LUNGS:  Auscultation: no wheezing/rhonchi/rales. CTA bilaterally.  HEART:   Heart sounds: normal. RRR. no murmur.  ABDOMEN:  General: soft nontender, nondistended, no masses.  FEMALE GENITOURINARY:  Pelvic Cervix normal, Uterus enlarged, mildly tender, no vaginal discharge.  EXTREMITIES:  General: No edema or calf tenderness.     Assessments   1. Pre-operative clearance - Z01.818 (Primary)   2. Menorrhagia with irregular cycle - N92.1, Possible adenomyosis.   3. Fibroids - D21.9   Treatment  1. Pre-operative clearance  Notes: Proceed with surgery. Due to adenomyosis, there are limitations with surgery. Reviewed alternatives including hysterectomy and [place]. In that pt is perimenopausal, likelihood of success with ablation may increase. Reviewed risks and benefits.    Visit Codes  84132 OV LEVEL 3.    Follow Up  2 Weeks post op

## 2017-12-03 NOTE — Anesthesia Procedure Notes (Signed)
Procedure Name: LMA Insertion Date/Time: 12/03/2017 8:33 AM Performed by: Graciela Husbands, CRNA Pre-anesthesia Checklist: Patient identified, Patient being monitored, Emergency Drugs available, Timeout performed and Suction available Patient Re-evaluated:Patient Re-evaluated prior to induction Oxygen Delivery Method: Circle System Utilized Preoxygenation: Pre-oxygenation with 100% oxygen Induction Type: IV induction Ventilation: Mask ventilation without difficulty LMA: LMA inserted LMA Size: 4.0 Number of attempts: 1 Placement Confirmation: positive ETCO2 and breath sounds checked- equal and bilateral Tube secured with: Tape Dental Injury: Teeth and Oropharynx as per pre-operative assessment

## 2017-12-03 NOTE — Transfer of Care (Signed)
Immediate Anesthesia Transfer of Care Note  Patient: Lorraine Evans  Procedure(s) Performed: DILATATION & CURETTAGE/HYSTEROSCOPY WITH HYDROTHERMAL ABLATION (N/A Vagina )  Patient Location: PACU  Anesthesia Type:General  Level of Consciousness: awake, alert  and oriented  Airway & Oxygen Therapy: Patient Spontanous Breathing and Patient connected to nasal cannula oxygen  Post-op Assessment: Report given to RN and Post -op Vital signs reviewed and stable  Post vital signs: Reviewed and stable  Last Vitals:  Vitals Value Taken Time  BP 136/84 12/03/2017  9:36 AM  Temp    Pulse 79 12/03/2017  9:40 AM  Resp 18 12/03/2017  9:40 AM  SpO2 100 % 12/03/2017  9:40 AM  Vitals shown include unvalidated device data.  Last Pain:  Vitals:   12/03/17 0725  TempSrc: Oral      Patients Stated Pain Goal: 3 (12/03/17 0725)  Complications: No apparent anesthesia complications

## 2017-12-03 NOTE — Interval H&P Note (Signed)
History and Physical Interval Note:  12/03/2017 8:17 AM  Lorraine Evans  has presented today for surgery, with the diagnosis of N92.1 Menorrhagia with irregular cycle  The various methods of treatment have been discussed with the patient and family. After consideration of risks, benefits and other options for treatment, the patient has consented to  Procedure(s) with comments: DILATATION & CURETTAGE/HYSTEROSCOPY WITH HYDROTHERMAL ABLATION (N/A) - Possible Myosure as a surgical intervention .  The patient's history has been reviewed, patient examined, no change in status, stable for surgery.  I have reviewed the patient's chart and labs.  Questions were answered to the patient's satisfaction.     Geryl Rankins

## 2017-12-04 ENCOUNTER — Encounter (HOSPITAL_COMMUNITY): Payer: Self-pay | Admitting: Obstetrics and Gynecology

## 2018-06-10 ENCOUNTER — Encounter: Payer: Self-pay | Admitting: Internal Medicine

## 2018-06-17 ENCOUNTER — Encounter: Payer: Self-pay | Admitting: Internal Medicine

## 2018-08-11 ENCOUNTER — Encounter: Payer: Self-pay | Admitting: Internal Medicine

## 2018-08-12 ENCOUNTER — Encounter: Payer: Self-pay | Admitting: Nurse Practitioner

## 2018-08-12 ENCOUNTER — Ambulatory Visit (INDEPENDENT_AMBULATORY_CARE_PROVIDER_SITE_OTHER): Payer: Managed Care, Other (non HMO) | Admitting: Nurse Practitioner

## 2018-08-12 VITALS — BP 136/88 | HR 81 | Temp 97.8°F | Ht 69.0 in | Wt 264.2 lb

## 2018-08-12 DIAGNOSIS — Z0000 Encounter for general adult medical examination without abnormal findings: Secondary | ICD-10-CM | POA: Diagnosis not present

## 2018-08-12 DIAGNOSIS — E6609 Other obesity due to excess calories: Secondary | ICD-10-CM

## 2018-08-12 DIAGNOSIS — Z1239 Encounter for other screening for malignant neoplasm of breast: Secondary | ICD-10-CM | POA: Diagnosis not present

## 2018-08-12 DIAGNOSIS — D649 Anemia, unspecified: Secondary | ICD-10-CM | POA: Diagnosis not present

## 2018-08-12 DIAGNOSIS — Z6839 Body mass index (BMI) 39.0-39.9, adult: Secondary | ICD-10-CM

## 2018-08-12 DIAGNOSIS — R030 Elevated blood-pressure reading, without diagnosis of hypertension: Secondary | ICD-10-CM | POA: Diagnosis not present

## 2018-08-12 LAB — POCT URINALYSIS DIPSTICK
Bilirubin, UA: NEGATIVE
Glucose, UA: NEGATIVE
Ketones, UA: NEGATIVE
LEUKOCYTES UA: NEGATIVE
NITRITE UA: NEGATIVE
PH UA: 6.5 (ref 5.0–8.0)
Protein, UA: NEGATIVE
Spec Grav, UA: 1.025 (ref 1.010–1.025)
UROBILINOGEN UA: 0.2 U/dL

## 2018-08-12 NOTE — Patient Instructions (Addendum)
Health Maintenance, Female Adopting a healthy lifestyle and getting preventive care can go a long way to promote health and wellness. Talk with your health care provider about what schedule of regular examinations is right for you. This is a good chance for you to check in with your provider about disease prevention and staying healthy. In between checkups, there are plenty of things you can do on your own. Experts have done a lot of research about which lifestyle changes and preventive measures are most likely to keep you healthy. Ask your health care provider for more information. Weight and diet Eat a healthy diet  Be sure to include plenty of vegetables, fruits, low-fat dairy products, and lean protein.  Do not eat a lot of foods high in solid fats, added sugars, or salt.  Get regular exercise. This is one of the most important things you can do for your health. ? Most adults should exercise for at least 150 minutes each week. The exercise should increase your heart rate and make you sweat (moderate-intensity exercise). ? Most adults should also do strengthening exercises at least twice a week. This is in addition to the moderate-intensity exercise. Maintain a healthy weight  Body mass index (BMI) is a measurement that can be used to identify possible weight problems. It estimates body fat based on height and weight. Your health care provider can help determine your BMI and help you achieve or maintain a healthy weight.  For females 20 years of age and older: ? A BMI below 18.5 is considered underweight. ? A BMI of 18.5 to 24.9 is normal. ? A BMI of 25 to 29.9 is considered overweight. ? A BMI of 30 and above is considered obese. Watch levels of cholesterol and blood lipids  You should start having your blood tested for lipids and cholesterol at 53 years of age, then have this test every 5 years.  You may need to have your cholesterol levels checked more often if: ? Your lipid or  cholesterol levels are high. ? You are older than 53 years of age. ? You are at high risk for heart disease. Cancer screening Lung Cancer  Lung cancer screening is recommended for adults 55-80 years old who are at high risk for lung cancer because of a history of smoking.  A yearly low-dose CT scan of the lungs is recommended for people who: ? Currently smoke. ? Have quit within the past 15 years. ? Have at least a 30-pack-year history of smoking. A pack year is smoking an average of one pack of cigarettes a day for 1 year.  Yearly screening should continue until it has been 15 years since you quit.  Yearly screening should stop if you develop a health problem that would prevent you from having lung cancer treatment. Breast Cancer  Practice breast self-awareness. This means understanding how your breasts normally appear and feel.  It also means doing regular breast self-exams. Let your health care provider know about any changes, no matter how small.  If you are in your 20s or 30s, you should have a clinical breast exam (CBE) by a health care provider every 1-3 years as part of a regular health exam.  If you are 40 or older, have a CBE every year. Also consider having a breast X-ray (mammogram) every year.  If you have a family history of breast cancer, talk to your health care provider about genetic screening.  If you are at high risk for breast cancer, talk   to your health care provider about having an MRI and a mammogram every year.  Breast cancer gene (BRCA) assessment is recommended for women who have family members with BRCA-related cancers. BRCA-related cancers include: ? Breast. ? Ovarian. ? Tubal. ? Peritoneal cancers.  Results of the assessment will determine the need for genetic counseling and BRCA1 and BRCA2 testing. Cervical Cancer Your health care provider may recommend that you be screened regularly for cancer of the pelvic organs (ovaries, uterus, and vagina).  This screening involves a pelvic examination, including checking for microscopic changes to the surface of your cervix (Pap test). You may be encouraged to have this screening done every 3 years, beginning at age 21.  For women ages 30-65, health care providers may recommend pelvic exams and Pap testing every 3 years, or they may recommend the Pap and pelvic exam, combined with testing for human papilloma virus (HPV), every 5 years. Some types of HPV increase your risk of cervical cancer. Testing for HPV may also be done on women of any age with unclear Pap test results.  Other health care providers may not recommend any screening for nonpregnant women who are considered low risk for pelvic cancer and who do not have symptoms. Ask your health care provider if a screening pelvic exam is right for you.  If you have had past treatment for cervical cancer or a condition that could lead to cancer, you need Pap tests and screening for cancer for at least 20 years after your treatment. If Pap tests have been discontinued, your risk factors (such as having a new sexual partner) need to be reassessed to determine if screening should resume. Some women have medical problems that increase the chance of getting cervical cancer. In these cases, your health care provider may recommend more frequent screening and Pap tests. Colorectal Cancer  This type of cancer can be detected and often prevented.  Routine colorectal cancer screening usually begins at 53 years of age and continues through 53 years of age.  Your health care provider may recommend screening at an earlier age if you have risk factors for colon cancer.  Your health care provider may also recommend using home test kits to check for hidden blood in the stool.  A small camera at the end of a tube can be used to examine your colon directly (sigmoidoscopy or colonoscopy). This is done to check for the earliest forms of colorectal cancer.  Routine  screening usually begins at age 50.  Direct examination of the colon should be repeated every 5-10 years through 53 years of age. However, you may need to be screened more often if early forms of precancerous polyps or small growths are found. Skin Cancer  Check your skin from head to toe regularly.  Tell your health care provider about any new moles or changes in moles, especially if there is a change in a mole's shape or color.  Also tell your health care provider if you have a mole that is larger than the size of a pencil eraser.  Always use sunscreen. Apply sunscreen liberally and repeatedly throughout the day.  Protect yourself by wearing long sleeves, pants, a wide-brimmed hat, and sunglasses whenever you are outside. Heart disease, diabetes, and high blood pressure  High blood pressure causes heart disease and increases the risk of stroke. High blood pressure is more likely to develop in: ? People who have blood pressure in the high end of the normal range (130-139/85-89 mm Hg). ? People   who are overweight or obese. ? People who are African American.  If you are 84-22 years of age, have your blood pressure checked every 3-5 years. If you are 67 years of age or older, have your blood pressure checked every year. You should have your blood pressure measured twice-once when you are at a hospital or clinic, and once when you are not at a hospital or clinic. Record the average of the two measurements. To check your blood pressure when you are not at a hospital or clinic, you can use: ? An automated blood pressure machine at a pharmacy. ? A home blood pressure monitor.  If you are between 52 years and 3 years old, ask your health care provider if you should take aspirin to prevent strokes.  Have regular diabetes screenings. This involves taking a blood sample to check your fasting blood sugar level. ? If you are at a normal weight and have a low risk for diabetes, have this test once  every three years after 53 years of age. ? If you are overweight and have a high risk for diabetes, consider being tested at a younger age or more often. Preventing infection Hepatitis B  If you have a higher risk for hepatitis B, you should be screened for this virus. You are considered at high risk for hepatitis B if: ? You were born in a country where hepatitis B is common. Ask your health care provider which countries are considered high risk. ? Your parents were born in a high-risk country, and you have not been immunized against hepatitis B (hepatitis B vaccine). ? You have HIV or AIDS. ? You use needles to inject street drugs. ? You live with someone who has hepatitis B. ? You have had sex with someone who has hepatitis B. ? You get hemodialysis treatment. ? You take certain medicines for conditions, including cancer, organ transplantation, and autoimmune conditions. Hepatitis C  Blood testing is recommended for: ? Everyone born from [birth year] through 1965. ? Anyone with known risk factors for hepatitis C. Sexually transmitted infections (STIs)  You should be screened for sexually transmitted infections (STIs) including gonorrhea and chlamydia if: ? You are sexually active and are younger than 53 years of age. ? You are older than 53 years of age and your health care provider tells you that you are at risk for this type of infection. ? Your sexual activity has changed since you were last screened and you are at an increased risk for chlamydia or gonorrhea. Ask your health care provider if you are at risk.  If you do not have HIV, but are at risk, it may be recommended that you take a prescription medicine daily to prevent HIV infection. This is called pre-exposure prophylaxis (PrEP). You are considered at risk if: ? You are sexually active and do not regularly use condoms or know the HIV status of your partner(s). ? You take drugs by injection. ? You are sexually active with a partner  who has HIV. Talk with your health care provider about whether you are at high risk of being infected with HIV. If you choose to begin PrEP, you should first be tested for HIV. You should then be tested every 3 months for as long as you are taking PrEP. Pregnancy  If you are premenopausal and you may become pregnant, ask your health care provider about preconception counseling.  If you may become pregnant, take 400 to 800 micrograms (mcg) of folic acid every  day.  If you want to prevent pregnancy, talk to your health care provider about birth control (contraception). Osteoporosis and menopause  Osteoporosis is a disease in which the bones lose minerals and strength with aging. This can result in serious bone fractures. Your risk for osteoporosis can be identified using a bone density scan.  If you are 30 years of age or older, or if you are at risk for osteoporosis and fractures, ask your health care provider if you should be screened.  Ask your health care provider whether you should take a calcium or vitamin D supplement to lower your risk for osteoporosis.  Menopause may have certain physical symptoms and risks.  Hormone replacement therapy may reduce some of these symptoms and risks. Talk to your health care provider about whether hormone replacement therapy is right for you. Follow these instructions at home:  Schedule regular health, dental, and eye exams.  Stay current with your immunizations.  Do not use any tobacco products including cigarettes, chewing tobacco, or electronic cigarettes.  If you are pregnant, do not drink alcohol.  If you are breastfeeding, limit how much and how often you drink alcohol.  Limit alcohol intake to no more than 1 drink per day for nonpregnant women. One drink equals 12 ounces of beer, 5 ounces of wine, or 1 ounces of hard liquor.  Do not use street drugs.  Do not share needles.  Ask your health care provider for help if you need support  or information about quitting drugs.  Tell your health care provider if you often feel depressed.  Tell your health care provider if you have ever been abused or do not feel safe at home. This information is not intended to replace advice given to you by your health care provider. Make sure you discuss any questions you have with your health care provider. Document Released: 01/21/2011 Document Revised: 12/14/2015 Document Reviewed: 04/11/2015 Elsevier Interactive Patient Education  2019 [employer]  Obesity, Adult Obesity is the condition of having too much total body fat. Being overweight or obese means that your weight is greater than what is considered healthy for your body size. Obesity is determined by a measurement called BMI. BMI is an estimate of body fat and is calculated from height and weight. For adults, a BMI of 30 or higher is considered obese. Obesity can eventually lead to other health concerns and major illnesses, including:  Stroke.  Coronary artery disease (CAD).  Type 2 diabetes.  Some types of cancer, including cancers of the colon, breast, uterus, and gallbladder.  Osteoarthritis.  High blood pressure (hypertension).  High cholesterol.  Sleep apnea.  Gallbladder stones.  Infertility problems. What are the causes? The main cause of obesity is taking in (consuming) more calories than your body uses for energy. Other factors that contribute to this condition may include:  Being born with genes that make you more likely to become obese.  Having a medical condition that causes obesity. These conditions include: ? Hypothyroidism. ? Polycystic ovarian syndrome (PCOS). ? Binge-eating disorder. ? Cushing syndrome.  Taking certain medicines, such as steroids, antidepressants, and seizure medicines.  Not being physically active (sedentary lifestyle).  Living where there are limited places to exercise safely or buy healthy foods.  Not getting enough  sleep. What increases the risk? The following factors may increase your risk of this condition:  Having a family history of obesity.  Being a woman of African-American descent.  Being a man of Hispanic descent. What are  the signs or symptoms? Having excessive body fat is the main symptom of this condition. How is this diagnosed? This condition may be diagnosed based on:  Your symptoms.  Your medical history.  A physical exam. Your health care provider may measure: ? Your BMI. If you are an adult with a BMI between 25 and less than 30, you are considered overweight. If you are an adult with a BMI of 30 or higher, you are considered obese. ? The distances around your hips and your waist (circumferences). These may be compared to each other to help diagnose your condition. ? Your skinfold thickness. Your health care provider may gently pinch a fold of your skin and measure it. How is this treated? Treatment for this condition often includes changing your lifestyle. Treatment may include some or all of the following:  Dietary changes. Work with your health care provider and a dietitian to set a weight-loss goal that is healthy and reasonable for you. Dietary changes may include eating: ? Smaller portions. A portion size is the amount of a particular food that is healthy for you to eat at one time. This varies from person to person. ? Low-calorie or low-fat options. ? More whole grains, fruits, and vegetables.  Regular physical activity. This may include aerobic activity (cardio) and strength training.  Medicine to help you lose weight. Your health care provider may prescribe medicine if you are unable to lose 1 pound a week after 6 weeks of eating more healthily and doing more physical activity.  Surgery. Surgical options may include gastric banding and gastric bypass. Surgery may be done if: ? Other treatments have not helped to improve your condition. ? You have a BMI of 40 or  higher. ? You have life-threatening health problems related to obesity. Follow these instructions at home:  Eating and drinking   Follow recommendations from your health care provider about what you eat and drink. Your health care provider may advise you to: ? Limit fast foods, sweets, and processed snack foods. ? Choose low-fat options, such as low-fat milk instead of whole milk. ? Eat 5 or more servings of fruits or vegetables every day. ? Eat at home more often. This gives you more control over what you eat. ? Choose healthy foods when you eat out. ? Learn what a healthy portion size is. ? Keep low-fat snacks on hand. ? Avoid sugary drinks, such as soda, fruit juice, iced tea sweetened with sugar, and flavored milk. ? Eat a healthy breakfast.  Drink enough water to keep your urine clear or pale yellow.  Do not go without eating for long periods of time (do not fast) or follow a fad diet. Fasting and fad diets can be unhealthy and even dangerous. Physical Activity  Exercise regularly, as told by your health care provider. Ask your health care provider what types of exercise are safe for you and how often you should exercise.  Warm up and stretch before being active.  Cool down and stretch after being active.  Rest between periods of activity. Lifestyle  Limit the time that you spend in front of your TV, computer, or video game system.  Find ways to reward yourself that do not involve food.  Limit alcohol intake to no more than 1 drink a day for nonpregnant women and 2 drinks a day for men. One drink equals 12 oz of beer, 5 oz of wine, or 1 oz of hard liquor. General instructions  Keep a weight loss  journal to keep track of the food you eat and how much you exercise you get.  Take over-the-counter and prescription medicines only as told by your health care provider.  Take vitamins and supplements only as told by your health care provider.  Consider joining a support  group. Your health care provider may be able to recommend a support group.  Keep all follow-up visits as told by your health care provider. This is important. Contact a health care provider if:  You are unable to meet your weight loss goal after 6 weeks of dietary and lifestyle changes. This information is not intended to replace advice given to you by your health care provider. Make sure you discuss any questions you have with your health care provider. Document Released: 08/15/2004 Document Revised: 12/11/2015 Document Reviewed: 04/26/2015 Elsevier Interactive Patient Education  2019 [employer].

## 2018-08-12 NOTE — Progress Notes (Signed)
Subjective:     Patient ID: Lorraine Evans , female    DOB: 10-Sep-1965 , 53 y.o.   MRN: [id number]   Chief Complaint  Patient presents with  . Annual Exam   The patient states she uses none for birth control. Last LMP was Patient's last menstrual period was 11/18/2017.. Negative for Dysmenorrhea and Negative for Menorrhagia Mammogram last done 09/02/2017. Negative for: breast discharge, breast lump(s), breast pain and breast self exam.  Pertinent negatives include abnormal bleeding (hematology), anxiety, decreased libido, depression, difficulty falling sleep, dyspareunia, history of infertility, nocturia, sexual dysfunction, sleep disturbances, urinary incontinence, urinary urgency, vaginal discharge and vaginal itching. Diet regular.The patient states her exercise level is  none, works a active job.    The patient's tobacco use is:  Social History   Tobacco Use  Smoking Status Never Smoker  Smokeless Tobacco Never Used  . She has been exposed to passive smoke. The patient's alcohol use is:  Social History   Substance and Sexual Activity  Alcohol Use No  . Additional information: Last pap 10/01/2017, next one scheduled for 09/2020 HPI  Here for health maintenance    Past Medical History:  Diagnosis Date  . Anemia   . H/O varicella   . History of measles      Family History  Problem Relation Age of Onset  . Hypertension Mother      Current Outpatient Medications:  .  Cholecalciferol (VITAMIN D PO), Take 1 capsule by mouth daily., Disp: , Rfl:  .  Iron-FA-B Cmp-C-Biot-Probiotic (FUSION PLUS) CAPS, Take 1 capsule by mouth daily., Disp: , Rfl: 4 .  Multiple Vitamin (MULTIVITAMIN WITH MINERALS) TABS tablet, Take 1 tablet by mouth daily., Disp: , Rfl:    No Known Allergies   Review of Systems  Constitutional: Negative.   HENT: Negative.   Eyes: Negative.   Respiratory: Negative.   Cardiovascular: Negative.   Gastrointestinal: Negative.   Endocrine: Negative.    Genitourinary: Negative.   Musculoskeletal: Negative.   Skin: Negative.   Allergic/Immunologic: Negative.   Neurological: Negative.   Hematological: Negative.   Psychiatric/Behavioral: Negative.      Today's Vitals   08/12/18 0906  BP: 136/88  Pulse: 81  Temp: 97.8 F (36.6 C)  TempSrc: Oral  SpO2: 97%  Weight: 264 lb 3.2 oz (119.8 kg)  Height: 5\' 9"  (1.753 m)  PainSc: 0-No pain   Body mass index is 39.02 kg/m.   Objective:  Physical Exam Constitutional:      General: She is not in acute distress.    Appearance: Normal appearance. She is well-developed. She is obese.  HENT:     Head: Normocephalic and atraumatic.     Right Ear: Hearing, tympanic membrane, ear canal and external ear normal.     Left Ear: Hearing, tympanic membrane, ear canal and external ear normal.     Nose: Nose normal. No congestion.     Mouth/Throat:     Mouth: Mucous membranes are moist.  Eyes:     General: Lids are normal.     Conjunctiva/sclera: Conjunctivae normal.     Pupils: Pupils are equal, round, and reactive to light.     Funduscopic exam:    Right eye: No papilledema.        Left eye: No papilledema.  Neck:     Musculoskeletal: Full passive range of motion without pain, normal range of motion and neck supple.     Thyroid: No thyroid mass.     Vascular: No  carotid bruit.  Cardiovascular:     Rate and Rhythm: Normal rate and regular rhythm.     Pulses: Normal pulses.     Heart sounds: Normal heart sounds. No murmur.  Pulmonary:     Effort: Pulmonary effort is normal.     Breath sounds: Normal breath sounds.  Abdominal:     General: Abdomen is flat. Bowel sounds are normal.     Palpations: Abdomen is soft.  Musculoskeletal: Normal range of motion.  Skin:    General: Skin is warm and dry.     Capillary Refill: Capillary refill takes less than 2 seconds.  Neurological:     General: No focal deficit present.     Mental Status: She is alert and oriented to person, place, and  time.     Cranial Nerves: No cranial nerve deficit.     Sensory: No sensory deficit.  Psychiatric:        Mood and Affect: Mood normal.        Behavior: Behavior normal.        Thought Content: Thought content normal.        Judgment: Judgment normal.         Assessment And Plan:     1. Encounter for general adult medical examination w/o abnormal findings . Behavior modifications discussed and diet history reviewed.   . Pt will continue to exercise regularly and modify diet with low GI, plant based foods and decrease intake of processed foods.  . Recommend intake of daily multivitamin, Vitamin D, and calcium.  . Recommend mammogram and colonoscopy for preventive screenings, as well as recommend immunizations that include influenza, TDAP (up to date) - POCT Urinalysis Dipstick (81002) - Lipid panel - CMP14 + Anion Gap - CBC with Diff  2. Anemia, unspecified type  Chronic, stable  She is taking fusion plus, will recheck iron studies today  - Iron, TIBC and Ferritin Panel  3. Encounter for screening for malignant neoplasm of breast  Pt instructed on Self Breast Exam.According to ACOG guidelines Women aged 28 and older are recommended to get an annual mammogram. Form completed and given to patient contact the [facility] for appointment scheduing.   Pt encouraged to get annual mammogram - MM Digital Screening; Future  4. Class 2 obesity due to excess calories without serious comorbidity with body mass index (BMI) of 39.0 to 39.9 in adult  Chronic  Discussed healthy diet and regular exercise options   Encouraged to exercise at least 150 minutes per week with 2 days of strength training - Hemoglobin A1c - TSH  5. Elevated blood-pressure reading without diagnosis of hypertension  Blood pressure is slightly elevated   Encouraged to avoid high salt foods  No medications at this time.         Arnette Felts, FNP

## 2018-08-13 LAB — IRON,TIBC AND FERRITIN PANEL
Ferritin: 117 ng/mL (ref 15–150)
Iron Saturation: 15 % (ref 15–55)
Iron: 56 ug/dL (ref 27–159)
TIBC: 374 ug/dL (ref 250–450)
UIBC: 318 ug/dL (ref 131–425)

## 2018-08-13 LAB — LIPID PANEL
CHOLESTEROL TOTAL: 198 mg/dL (ref 100–199)
Chol/HDL Ratio: 3.5 ratio (ref 0.0–4.4)
HDL: 56 mg/dL (ref >39)
LDL Calculated: 128 mg/dL — ABNORMAL HIGH (ref 0–99)
TRIGLYCERIDES: 68 mg/dL (ref 0–149)
VLDL Cholesterol Cal: 14 mg/dL (ref 5–40)

## 2018-08-13 LAB — CMP14 + ANION GAP
A/G RATIO: 1.4 (ref 1.2–2.2)
ALBUMIN: 4.3 g/dL (ref 3.8–4.9)
ALT: 18 IU/L (ref 0–32)
AST: 32 IU/L (ref 0–40)
Alkaline Phosphatase: 96 IU/L (ref 39–117)
Anion Gap: 22 mmol/L — ABNORMAL HIGH (ref 10.0–18.0)
BUN/Creatinine Ratio: 16 (ref 9–23)
BUN: 10 mg/dL (ref 6–24)
Bilirubin Total: 0.5 mg/dL (ref 0.0–1.2)
CALCIUM: 9.3 mg/dL (ref 8.7–10.2)
CO2: 18 mmol/L — ABNORMAL LOW (ref 20–29)
Chloride: 103 mmol/L (ref 96–106)
Creatinine, Ser: 0.61 mg/dL (ref 0.57–1.00)
GFR, EST AFRICAN AMERICAN: 121 mL/min/{1.73_m2} (ref >59)
GFR, EST NON AFRICAN AMERICAN: 105 mL/min/{1.73_m2} (ref >59)
Globulin, Total: 3.1 g/dL (ref 1.5–4.5)
Glucose: 90 mg/dL (ref 65–99)
POTASSIUM: 4.6 mmol/L (ref 3.5–5.2)
Sodium: 143 mmol/L (ref 134–144)
TOTAL PROTEIN: 7.4 g/dL (ref 6.0–8.5)

## 2018-08-13 LAB — CBC WITH DIFFERENTIAL/PLATELET
BASOS ABS: 0.1 10*3/uL (ref 0.0–0.2)
Basos: 1 %
EOS (ABSOLUTE): 0.2 10*3/uL (ref 0.0–0.4)
Eos: 3 %
HEMOGLOBIN: 12.2 g/dL (ref 11.1–15.9)
Hematocrit: 35.4 % (ref 34.0–46.6)
IMMATURE GRANS (ABS): 0 10*3/uL (ref 0.0–0.1)
Immature Granulocytes: 0 %
LYMPHS: 23 %
Lymphocytes Absolute: 1.5 10*3/uL (ref 0.7–3.1)
MCH: 29.3 pg (ref 26.6–33.0)
MCHC: 34.5 g/dL (ref 31.5–35.7)
MCV: 85 fL (ref 79–97)
MONOCYTES: 7 %
Monocytes Absolute: 0.4 10*3/uL (ref 0.1–0.9)
NEUTROS ABS: 4.2 10*3/uL (ref 1.4–7.0)
Neutrophils: 66 %
PLATELETS: 262 10*3/uL (ref 150–450)
RBC: 4.17 x10E6/uL (ref 3.77–5.28)
RDW: 14 % (ref 11.7–15.4)
WBC: 6.4 10*3/uL (ref 3.4–10.8)

## 2018-08-13 LAB — TSH: TSH: 1.19 u[IU]/mL (ref 0.450–4.500)

## 2018-08-13 LAB — HEMOGLOBIN A1C
Est. average glucose Bld gHb Est-mCnc: 100 mg/dL
Hgb A1c MFr Bld: 5.1 % (ref 4.8–5.6)

## 2018-09-11 ENCOUNTER — Ambulatory Visit: Payer: BLUE CROSS/BLUE SHIELD

## 2018-11-11 ENCOUNTER — Ambulatory Visit: Payer: Managed Care, Other (non HMO) | Admitting: Nurse Practitioner

## 2019-02-04 ENCOUNTER — Encounter (INDEPENDENT_AMBULATORY_CARE_PROVIDER_SITE_OTHER): Payer: Managed Care, Other (non HMO) | Admitting: Ophthalmology

## 2019-02-04 ENCOUNTER — Other Ambulatory Visit: Payer: Self-pay

## 2019-02-04 DIAGNOSIS — H2513 Age-related nuclear cataract, bilateral: Secondary | ICD-10-CM

## 2019-02-04 DIAGNOSIS — H43813 Vitreous degeneration, bilateral: Secondary | ICD-10-CM

## 2019-02-04 DIAGNOSIS — H33023 Retinal detachment with multiple breaks, bilateral: Secondary | ICD-10-CM

## 2019-02-09 ENCOUNTER — Encounter: Payer: Self-pay | Admitting: Nurse Practitioner

## 2019-02-09 ENCOUNTER — Other Ambulatory Visit: Payer: Self-pay

## 2019-02-09 ENCOUNTER — Ambulatory Visit: Payer: Managed Care, Other (non HMO) | Admitting: Nurse Practitioner

## 2019-02-09 VITALS — BP 136/90 | HR 91 | Temp 98.4°F | Ht 67.4 in | Wt 254.2 lb

## 2019-02-09 DIAGNOSIS — R030 Elevated blood-pressure reading, without diagnosis of hypertension: Secondary | ICD-10-CM | POA: Diagnosis not present

## 2019-02-09 DIAGNOSIS — D649 Anemia, unspecified: Secondary | ICD-10-CM

## 2019-02-09 HISTORY — DX: Elevated blood-pressure reading, without diagnosis of hypertension: R03.0

## 2019-02-09 MED ORDER — FUSION PLUS PO CAPS: 1.0000 | ORAL_CAPSULE | Freq: Every day | ORAL | 6 refills | Status: DC

## 2019-02-09 NOTE — Progress Notes (Signed)
  Subjective:     Patient ID: Lorraine Evans , female    DOB: [birth year] , 52 y.o.   MRN: [id number]   Chief Complaint  Patient presents with  . Diabetes  . Hypertension    HPI  Hypertension Chronicity: elevated blood pressure. Pertinent negatives include no anxiety or blurred vision.     Past Medical History:  Diagnosis Date  . Anemia   . H/O varicella   . History of measles      Family History  Problem Relation Age of Onset  . Hypertension Mother      Current Outpatient Medications:  .  Cholecalciferol (VITAMIN D PO), Take 1 capsule by mouth daily., Disp: , Rfl:  .  Multiple Vitamin (MULTIVITAMIN WITH MINERALS) TABS tablet, Take 1 tablet by mouth daily., Disp: , Rfl:  .  vitamin C (ASCORBIC ACID) 500 MG tablet, Take 500 mg by mouth daily., Disp: , Rfl:  .  Iron-FA-B Cmp-C-Biot-Probiotic (FUSION PLUS) CAPS, Take 1 capsule by mouth daily., Disp: , Rfl: 4   No Known Allergies   Review of Systems  Constitutional: Negative.   HENT: Negative.   Eyes: Negative.  Negative for blurred vision.  Respiratory: Negative.   Cardiovascular: Negative.   Gastrointestinal: Negative.   Endocrine: Negative.   Genitourinary: Negative.   Musculoskeletal: Negative.   Skin: Negative.   Allergic/Immunologic: Negative.   Neurological: Negative.   Hematological: Negative.   Psychiatric/Behavioral: Negative.      Today's Vitals   02/09/19 0953  BP: 136/90  Pulse: 91  Temp: 98.4 F (36.9 C)  TempSrc: Oral  Weight: 254 lb 3.2 oz (115.3 kg)  Height: 5' 7.4" (1.712 m)  PainSc: 0-No pain   Body mass index is 39.34 kg/m.   Objective:  Physical Exam Constitutional:      Appearance: Normal appearance.  Cardiovascular:     Rate and Rhythm: Normal rate and regular rhythm.     Pulses: Normal pulses.     Heart sounds: Normal heart sounds. No murmur.  Pulmonary:     Effort: Pulmonary effort is normal.     Breath sounds: Normal breath sounds.  Skin:    General: Skin is dry.     Capillary Refill: Capillary refill takes less than 2 seconds.  Neurological:     General: No focal deficit present.     Mental Status: She is alert and oriented to person, place, and time.  Psychiatric:        Mood and Affect: Mood normal.        Behavior: Behavior normal.        Thought Content: Thought content normal.        Judgment: Judgment normal.         Assessment And Plan:     1. Elevated blood-pressure reading without diagnosis of hypertension  Remains slightly elevated, she is to increase her physical activity and water intake, and return in 3 months for follow up   If continues to be elevated will start on low dose diuretic  Information given on low salt diet  2. Anemia, unspecified type  Chronic  Doing well with fusion plus Sent refill of Fusion plus   [name], FNP    THE PATIENT IS ENCOURAGED TO PRACTICE SOCIAL DISTANCING DUE TO THE COVID-19 PANDEMIC.

## 2019-02-09 NOTE — Patient Instructions (Addendum)

## 2019-02-12 ENCOUNTER — Encounter (INDEPENDENT_AMBULATORY_CARE_PROVIDER_SITE_OTHER): Payer: Managed Care, Other (non HMO) | Admitting: Ophthalmology

## 2019-02-12 ENCOUNTER — Other Ambulatory Visit: Payer: Self-pay

## 2019-02-12 DIAGNOSIS — H33021 Retinal detachment with multiple breaks, right eye: Secondary | ICD-10-CM | POA: Diagnosis not present

## 2019-03-05 ENCOUNTER — Other Ambulatory Visit: Payer: Self-pay

## 2019-03-05 ENCOUNTER — Encounter (INDEPENDENT_AMBULATORY_CARE_PROVIDER_SITE_OTHER): Payer: Managed Care, Other (non HMO) | Admitting: Ophthalmology

## 2019-03-05 DIAGNOSIS — H33303 Unspecified retinal break, bilateral: Secondary | ICD-10-CM

## 2019-03-09 ENCOUNTER — Telehealth: Payer: Self-pay

## 2019-03-09 NOTE — Telephone Encounter (Signed)
We received a letter from the pharmacy stating that the patient's prescription for fusion plus caps are on backorder. I have called the pt to tell her she can purchase iron 325mg  OTC and take it daily. I left pt v/m to call the office. YRL,RMA

## 2019-03-10 ENCOUNTER — Telehealth: Payer: Self-pay

## 2019-03-10 NOTE — Telephone Encounter (Signed)
We received a letter from the pharmacy stating that the patient's prescription for fusion plus caps are on backorder. I have called the pt to tell her she can purchase iron 325mg  OTC and take it daily. I left pt v/m to call the office. YRL,RMA   PATIENT RETURNED CALL AND WAS NOTIFIED. YRL,RMA

## 2019-04-08 ENCOUNTER — Encounter: Payer: Self-pay | Admitting: Internal Medicine

## 2019-05-12 ENCOUNTER — Encounter: Payer: Self-pay | Admitting: Nurse Practitioner

## 2019-05-12 ENCOUNTER — Other Ambulatory Visit: Payer: Self-pay

## 2019-05-12 ENCOUNTER — Ambulatory Visit (INDEPENDENT_AMBULATORY_CARE_PROVIDER_SITE_OTHER): Payer: Managed Care, Other (non HMO) | Admitting: Nurse Practitioner

## 2019-05-12 VITALS — BP 116/80 | HR 84 | Temp 98.0°F | Ht 66.6 in | Wt 256.6 lb

## 2019-05-12 DIAGNOSIS — Z6841 Body Mass Index (BMI) 40.0 and over, adult: Secondary | ICD-10-CM

## 2019-05-12 DIAGNOSIS — R030 Elevated blood-pressure reading, without diagnosis of hypertension: Secondary | ICD-10-CM

## 2019-05-12 NOTE — Progress Notes (Signed)
Subjective:     Patient ID: Lorraine Evans , female    DOB: 1965-12-25 , 53 y.o.   MRN: [id number]   Chief Complaint  Patient presents with  . Hypertension    patient presents today for a 3 month f/u     HPI  Wt Readings from Last 3 Encounters: 05/12/19 : 256 lb 9.6 oz (116.4 kg) 02/09/19 : 254 lb 3.2 oz (115.3 kg) 08/12/18 : 264 lb 3.2 oz (119.8 kg)  She has cut back on her salt intake in her seasonings.    Hypertension This is a chronic (elevated blood pressure) problem. The current episode started more than 1 year ago. The problem has been gradually worsening since onset. The problem is controlled. Pertinent negatives include no anxiety, blurred vision, chest pain or palpitations.     Past Medical History:  Diagnosis Date  . Anemia   . H/O varicella   . History of measles      Family History  Problem Relation Age of Onset  . Hypertension Mother      Current Outpatient Medications:  .  Cholecalciferol (VITAMIN D PO), Take 1 capsule by mouth daily., Disp: , Rfl:  .  Iron-FA-B Cmp-C-Biot-Probiotic (FUSION PLUS) CAPS, Take 1 capsule by mouth daily., Disp: 30 capsule, Rfl: 6 .  Multiple Vitamin (MULTIVITAMIN WITH MINERALS) TABS tablet, Take 1 tablet by mouth daily., Disp: , Rfl:  .  vitamin C (ASCORBIC ACID) 500 MG tablet, Take 500 mg by mouth daily., Disp: , Rfl:    No Known Allergies   Review of Systems  Constitutional: Negative.   HENT: Negative.   Eyes: Negative.  Negative for blurred vision.  Respiratory: Negative.   Cardiovascular: Negative.  Negative for chest pain, palpitations and leg swelling.  Endocrine: Negative.  Negative for polydipsia, polyphagia and polyuria.  Genitourinary: Negative.   Allergic/Immunologic: Negative.   Neurological: Negative.   Psychiatric/Behavioral: Negative.      Today's Vitals   05/12/19 0926  BP: 116/80  Pulse: 84  Temp: 98 F (36.7 C)  TempSrc: Oral  Weight: 256 lb 9.6 oz (116.4 kg)  Height: 5' 6.6" (1.692 m)   PainSc: 0-No pain   Body mass index is 40.67 kg/m.   Objective:  Physical Exam Nursing note reviewed.  Constitutional:      Appearance: Normal appearance.  Cardiovascular:     Rate and Rhythm: Normal rate and regular rhythm.     Pulses: Normal pulses.     Heart sounds: Normal heart sounds. No murmur.  Pulmonary:     Effort: Pulmonary effort is normal.     Breath sounds: Normal breath sounds.  Skin:    General: Skin is dry.     Capillary Refill: Capillary refill takes less than 2 seconds.  Neurological:     General: No focal deficit present.     Mental Status: She is alert and oriented to person, place, and time.  Psychiatric:        Mood and Affect: Mood normal.        Behavior: Behavior normal.        Thought Content: Thought content normal.        Judgment: Judgment normal.         Assessment And Plan:     1. Elevated blood-pressure reading without diagnosis of hypertension  She has improved and is back to normal  No medications needed at this time  Continue with limiting salt intake  2. Class 3 severe obesity due to excess  calories with serious comorbidity and body mass index (BMI) of 40.0 to 44.9 in adult [facility])  Chronic, she has had a 2 lb weight gain since July  Encouraged to start back exercising regularly    Arnette Felts, FNP    THE PATIENT IS ENCOURAGED TO PRACTICE SOCIAL DISTANCING DUE TO THE COVID-19 PANDEMIC.

## 2019-07-08 ENCOUNTER — Encounter (INDEPENDENT_AMBULATORY_CARE_PROVIDER_SITE_OTHER): Payer: Managed Care, Other (non HMO) | Admitting: Ophthalmology

## 2019-07-08 ENCOUNTER — Other Ambulatory Visit: Payer: Self-pay

## 2019-07-08 DIAGNOSIS — H2513 Age-related nuclear cataract, bilateral: Secondary | ICD-10-CM

## 2019-07-08 DIAGNOSIS — H43813 Vitreous degeneration, bilateral: Secondary | ICD-10-CM | POA: Diagnosis not present

## 2019-07-08 DIAGNOSIS — H33303 Unspecified retinal break, bilateral: Secondary | ICD-10-CM | POA: Diagnosis not present

## 2019-07-14 ENCOUNTER — Encounter (INDEPENDENT_AMBULATORY_CARE_PROVIDER_SITE_OTHER): Payer: Managed Care, Other (non HMO) | Admitting: Ophthalmology

## 2019-07-14 DIAGNOSIS — H33302 Unspecified retinal break, left eye: Secondary | ICD-10-CM

## 2019-08-16 ENCOUNTER — Telehealth: Payer: Self-pay

## 2019-08-16 ENCOUNTER — Other Ambulatory Visit: Payer: Self-pay | Admitting: Internal Medicine

## 2019-08-16 DIAGNOSIS — Z1231 Encounter for screening mammogram for malignant neoplasm of breast: Secondary | ICD-10-CM

## 2019-08-16 NOTE — Telephone Encounter (Signed)
Patient called requesting the number to the breast center so she can schedule for her mammogram. I returned pt call and provided her with the number. YRL,RMA

## 2019-08-18 ENCOUNTER — Encounter: Payer: Self-pay | Admitting: Nurse Practitioner

## 2019-08-18 ENCOUNTER — Ambulatory Visit (INDEPENDENT_AMBULATORY_CARE_PROVIDER_SITE_OTHER): Payer: 59 | Admitting: Nurse Practitioner

## 2019-08-18 ENCOUNTER — Other Ambulatory Visit: Payer: Self-pay

## 2019-08-18 VITALS — BP 128/80 | HR 83 | Temp 97.9°F | Ht 68.2 in | Wt 262.0 lb

## 2019-08-18 DIAGNOSIS — Z0000 Encounter for general adult medical examination without abnormal findings: Secondary | ICD-10-CM

## 2019-08-18 DIAGNOSIS — Z6839 Body mass index (BMI) 39.0-39.9, adult: Secondary | ICD-10-CM

## 2019-08-18 DIAGNOSIS — E6609 Other obesity due to excess calories: Secondary | ICD-10-CM | POA: Diagnosis not present

## 2019-08-18 DIAGNOSIS — D649 Anemia, unspecified: Secondary | ICD-10-CM

## 2019-08-18 LAB — POCT URINALYSIS DIPSTICK
Bilirubin, UA: NEGATIVE
Glucose, UA: NEGATIVE
Ketones, UA: NEGATIVE
Leukocytes, UA: NEGATIVE
Nitrite, UA: NEGATIVE
Protein, UA: NEGATIVE
Spec Grav, UA: >=1.03 — AB (ref 1.010–1.025)
Urobilinogen, UA: 0.2 E.U./dL
pH, UA: 6 (ref 5.0–8.0)

## 2019-08-18 MED ORDER — IRON (FERROUS SULFATE) 325 (65 FE) MG PO TABS: 1.0000 | ORAL_TABLET | Freq: Every day | ORAL | 3 refills | Status: AC

## 2019-08-18 NOTE — Progress Notes (Signed)
Subjective:     Patient ID: Lorraine Evans , female    DOB: 10-19-1965 , 54 y.o.   MRN: [id number]   Chief Complaint  Patient presents with  . Annual Exam   The patient states she post menopause.   Mammogram last done 09/02/2017. Negative for: breast discharge, breast lump(s), breast pain and breast self exam.  Pertinent negatives include abnormal bleeding (hematology), anxiety, decreased libido, depression, difficulty falling sleep, dyspareunia, history of infertility, nocturia, sexual dysfunction, sleep disturbances, urinary incontinence, urinary urgency, vaginal discharge and vaginal itching. Diet regular. The patient states her exercise level is minimal.       The patient's tobacco use is:  Social History   Tobacco Use  Smoking Status Never Smoker  Smokeless Tobacco Never Used   She has been exposed to passive smoke. The patient's alcohol use is:  Social History   Substance and Sexual Activity  Alcohol Use No  . 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   Wt Readings from Last 3 Encounters: 08/18/19 : 262 lb (118.8 kg) 05/12/19 : 256 lb 9.6 oz (116.4 kg) 02/09/19 : 254 lb 3.2 oz (115.3 kg)     Past Medical History:  Diagnosis Date  . Anemia   . H/O varicella   . History of measles      Family History  Problem Relation Age of Onset  . Hypertension Mother      Current Outpatient Medications:  .  Cholecalciferol (VITAMIN D PO), Take 1 capsule by mouth daily., Disp: , Rfl:  .  Iron-FA-B Cmp-C-Biot-Probiotic (FUSION PLUS) CAPS, Take 1 capsule by mouth daily., Disp: 30 capsule, Rfl: 6 .  Multiple Vitamin (MULTIVITAMIN WITH MINERALS) TABS tablet, Take 1 tablet by mouth daily., Disp: , Rfl:  .  Multiple Vitamins-Minerals (ZINC PO), Take by mouth. Take one tablet daily, Disp: , Rfl:  .  vitamin C (ASCORBIC ACID) 500 MG tablet, Take 500 mg by mouth daily., Disp: , Rfl:     No Known Allergies   Review of Systems  Constitutional: Negative.   HENT: Negative.   Eyes: Negative.   Respiratory: Negative.   Cardiovascular: Negative.  Negative for chest pain, palpitations and leg swelling.  Gastrointestinal: Negative.   Endocrine: Negative.   Genitourinary: Negative.   Musculoskeletal: Negative.   Skin: Negative.   Allergic/Immunologic: Negative.   Neurological: Negative.  Negative for dizziness and headaches.  Hematological: Negative.   Psychiatric/Behavioral: Negative.      Today's Vitals   08/18/19 1109  BP: 128/80  Pulse: 83  Temp: 97.9 F (36.6 C)  TempSrc: Oral  Weight: 262 lb (118.8 kg)  Height: 5' 8.2" (1.732 m)  PainSc: 0-No pain   Body mass index is 39.6 kg/m.   Objective:  Physical Exam Constitutional:      General: She is not in acute distress.    Appearance: Normal appearance. She is well-developed. She is obese.  HENT:     Head: Normocephalic and atraumatic.     Right Ear: Hearing, tympanic membrane, ear canal and external ear normal.     Left Ear: Hearing, tympanic membrane, ear canal and external ear normal.     Nose: Nose normal. No congestion.     Mouth/Throat:     Mouth: Mucous membranes are moist.  Eyes:     General: Lids are normal.     Conjunctiva/sclera: Conjunctivae normal.     Pupils: Pupils are  equal, round, and reactive to light.     Funduscopic exam:    Right eye: No papilledema.        Left eye: No papilledema.  Neck:     Thyroid: No thyroid mass.     Vascular: No carotid bruit.  Cardiovascular:     Rate and Rhythm: Normal rate and regular rhythm.     Pulses: Normal pulses.     Heart sounds: Normal heart sounds. No murmur.  Pulmonary:     Effort: Pulmonary effort is normal. No respiratory distress.     Breath sounds: Normal breath sounds.  Abdominal:     General: Abdomen is flat. Bowel sounds are normal.     Palpations: Abdomen is soft.  Musculoskeletal:        General: No swelling or tenderness.  Normal range of motion.     Cervical back: Full passive range of motion without pain, normal range of motion and neck supple.     Right lower leg: No edema.     Left lower leg: No edema.  Skin:    General: Skin is warm and dry.     Capillary Refill: Capillary refill takes less than 2 seconds.  Neurological:     General: No focal deficit present.     Mental Status: She is alert and oriented to person, place, and time.     Cranial Nerves: No cranial nerve deficit.     Sensory: No sensory deficit.  Psychiatric:        Mood and Affect: Mood normal.        Behavior: Behavior normal.        Thought Content: Thought content normal.        Judgment: Judgment normal.         Assessment And Plan:     1. Encounter for general adult medical examination w/o abnormal findings . Behavior modifications discussed and diet history reviewed.   . Pt will continue to exercise regularly and modify diet with low GI, plant based foods and decrease intake of processed foods.  . Recommend intake of daily multivitamin, Vitamin D, and calcium.  . Recommend mammogram for preventive screenings, as well as recommend immunizations that include influenza, TDAP (up to date) - POCT Urinalysis Dipstick (81002) - Lipid panel - CMP14 + Anion Gap - CBC with Diff  2. Class 2 obesity due to excess calories without serious comorbidity with body mass index (BMI) of 39.0 to 39.9 in adult  Chronic  Discussed healthy diet and regular exercise options   Encouraged to exercise at least 150 minutes per week with 2 days of strength training  Phentermine started advised to take in am  She is to return in 2 months for weight check  EKG done with NSR - Hemoglobin A1c - TSH  3. Anemia, unspecified type  Chronic, stable  Will recheck iron studies  - Iron, TIBC and Ferritin Panel           Arnette Felts, FNP

## 2019-08-18 NOTE — Patient Instructions (Signed)
Health Maintenance, Female Adopting a healthy lifestyle and getting preventive care are important in promoting health and wellness. Ask your health care provider about:  The right schedule for you to have regular tests and exams.  Things you can do on your own to prevent diseases and keep yourself healthy. What should I know about diet, weight, and exercise? Eat a healthy diet   Eat a diet that includes plenty of vegetables, fruits, low-fat dairy products, and lean protein.  Do not eat a lot of foods that are high in solid fats, added sugars, or sodium. Maintain a healthy weight Body mass index (BMI) is used to identify weight problems. It estimates body fat based on height and weight. Your health care provider can help determine your BMI and help you achieve or maintain a healthy weight. Get regular exercise Get regular exercise. This is one of the most important things you can do for your health. Most adults should:  Exercise for at least 150 minutes each week. The exercise should increase your heart rate and make you sweat (moderate-intensity exercise).  Do strengthening exercises at least twice a week. This is in addition to the moderate-intensity exercise.  Spend less time sitting. Even light physical activity can be beneficial. Watch cholesterol and blood lipids Have your blood tested for lipids and cholesterol at 54 years of age, then have this test every 5 years. Have your cholesterol levels checked more often if:  Your lipid or cholesterol levels are high.  You are older than 54 years of age.  You are at high risk for heart disease. What should I know about cancer screening? Depending on your health history and family history, you may need to have cancer screening at various ages. This may include screening for:  Breast cancer.  Cervical cancer.  Colorectal cancer.  Skin cancer.  Lung cancer. What should I know about heart disease, diabetes, and high blood  pressure? Blood pressure and heart disease  High blood pressure causes heart disease and increases the risk of stroke. This is more likely to develop in people who have high blood pressure readings, are of African descent, or are overweight.  Have your blood pressure checked: ? Every 3-5 years if you are 18-39 years of age. ? Every year if you are 40 years old or older. Diabetes Have regular diabetes screenings. This checks your fasting blood sugar level. Have the screening done:  Once every three years after age 40 if you are at a normal weight and have a low risk for diabetes.  More often and at a younger age if you are overweight or have a high risk for diabetes. What should I know about preventing infection? Hepatitis B If you have a higher risk for hepatitis B, you should be screened for this virus. Talk with your health care provider to find out if you are at risk for hepatitis B infection. Hepatitis C Testing is recommended for:  Everyone born from 1945 through 1965.  Anyone with known risk factors for hepatitis C. Sexually transmitted infections (STIs)  Get screened for STIs, including gonorrhea and chlamydia, if: ? You are sexually active and are younger than 54 years of age. ? You are older than 54 years of age and your health care provider tells you that you are at risk for this type of infection. ? Your sexual activity has changed since you were last screened, and you are at increased risk for chlamydia or gonorrhea. Ask your health care provider if   you are at risk.  Ask your health care provider about whether you are at high risk for HIV. Your health care provider may recommend a prescription medicine to help prevent HIV infection. If you choose to take medicine to prevent HIV, you should first get tested for HIV. You should then be tested every 3 months for as long as you are taking the medicine. Pregnancy  If you are about to stop having your period (premenopausal) and  you may become pregnant, seek counseling before you get pregnant.  Take 400 to 800 micrograms (mcg) of folic acid every day if you become pregnant.  Ask for birth control (contraception) if you want to prevent pregnancy. Osteoporosis and menopause Osteoporosis is a disease in which the bones lose minerals and strength with aging. This can result in bone fractures. If you are 65 years old or older, or if you are at risk for osteoporosis and fractures, ask your health care provider if you should:  Be screened for bone loss.  Take a calcium or vitamin D supplement to lower your risk of fractures.  Be given hormone replacement therapy (HRT) to treat symptoms of menopause. Follow these instructions at home: Lifestyle  Do not use any products that contain nicotine or tobacco, such as cigarettes, e-cigarettes, and chewing tobacco. If you need help quitting, ask your health care provider.  Do not use street drugs.  Do not share needles.  Ask your health care provider for help if you need support or information about quitting drugs. Alcohol use  Do not drink alcohol if: ? Your health care provider tells you not to drink. ? You are pregnant, may be pregnant, or are planning to become pregnant.  If you drink alcohol: ? Limit how much you use to 0-1 drink a day. ? Limit intake if you are breastfeeding.  Be aware of how much alcohol is in your drink. In the [place] one drink equals one 12 oz bottle of beer (355 mL), one 5 oz glass of wine (148 mL), or one 1 oz glass of hard liquor (44 mL). General instructions  Schedule regular health, dental, and eye exams.  Stay current with your vaccines.  Tell your health care provider if: ? You often feel depressed. ? You have ever been abused or do not feel safe at home. Summary  Adopting a healthy lifestyle and getting preventive care are important in promoting health and wellness.  Follow your health care provider's instructions about healthy  diet, exercising, and getting tested or screened for diseases.  Follow your health care provider's instructions on monitoring your cholesterol and blood pressure. This information is not intended to replace advice given to you by your health care provider. Make sure you discuss any questions you have with your health care provider. Document Revised: 07/01/2018 Document Reviewed: 07/01/2018 Elsevier Patient Education  2020 [employer]  

## 2019-08-19 LAB — BMP8+EGFR
BUN/Creatinine Ratio: 19 (ref 9–23)
BUN: 15 mg/dL (ref 6–24)
CO2: 24 mmol/L (ref 20–29)
Calcium: 9.7 mg/dL (ref 8.7–10.2)
Chloride: 101 mmol/L (ref 96–106)
Creatinine, Ser: 0.77 mg/dL (ref 0.57–1.00)
GFR calc Af Amer: 102 mL/min/{1.73_m2} (ref >59)
GFR calc non Af Amer: 88 mL/min/{1.73_m2} (ref >59)
Glucose: 92 mg/dL (ref 65–99)
Potassium: 4.2 mmol/L (ref 3.5–5.2)
Sodium: 141 mmol/L (ref 134–144)

## 2019-08-19 LAB — CBC
Hematocrit: 39.8 % (ref 34.0–46.6)
Hemoglobin: 13.3 g/dL (ref 11.1–15.9)
MCH: 28.5 pg (ref 26.6–33.0)
MCHC: 33.4 g/dL (ref 31.5–35.7)
MCV: 85 fL (ref 79–97)
Platelets: 316 10*3/uL (ref 150–450)
RBC: 4.67 x10E6/uL (ref 3.77–5.28)
RDW: 13.9 % (ref 11.7–15.4)
WBC: 7.2 10*3/uL (ref 3.4–10.8)

## 2019-08-19 LAB — LIPID PANEL
Chol/HDL Ratio: 3.5 ratio (ref 0.0–4.4)
Cholesterol, Total: 195 mg/dL (ref 100–199)
HDL: 56 mg/dL (ref >39)
LDL Chol Calc (NIH): 127 mg/dL — ABNORMAL HIGH (ref 0–99)
Triglycerides: 64 mg/dL (ref 0–149)
VLDL Cholesterol Cal: 12 mg/dL (ref 5–40)

## 2019-08-19 LAB — IRON,TIBC AND FERRITIN PANEL
Ferritin: 231 ng/mL — ABNORMAL HIGH (ref 15–150)
Iron Saturation: 22 % (ref 15–55)
Iron: 72 ug/dL (ref 27–159)
Total Iron Binding Capacity: 325 ug/dL (ref 250–450)
UIBC: 253 ug/dL (ref 131–425)

## 2019-08-19 LAB — INSULIN, RANDOM: INSULIN: 11.3 u[IU]/mL (ref 2.6–24.9)

## 2019-09-20 ENCOUNTER — Ambulatory Visit
Admission: RE | Admit: 2019-09-20 | Discharge: 2019-09-20 | Disposition: A | Discharge status: Home / Self Care | Payer: 59 | Source: Ambulatory Visit | Attending: Internal Medicine | Admitting: Internal Medicine

## 2019-09-20 ENCOUNTER — Other Ambulatory Visit: Payer: Self-pay

## 2019-09-20 DIAGNOSIS — Z1231 Encounter for screening mammogram for malignant neoplasm of breast: Secondary | ICD-10-CM

## 2019-11-12 ENCOUNTER — Encounter (INDEPENDENT_AMBULATORY_CARE_PROVIDER_SITE_OTHER): Payer: Managed Care, Other (non HMO) | Admitting: Ophthalmology

## 2019-11-12 DIAGNOSIS — H33303 Unspecified retinal break, bilateral: Secondary | ICD-10-CM

## 2019-11-12 DIAGNOSIS — H43813 Vitreous degeneration, bilateral: Secondary | ICD-10-CM

## 2019-11-16 ENCOUNTER — Ambulatory Visit: Payer: 59 | Admitting: Nurse Practitioner

## 2019-11-18 ENCOUNTER — Encounter: Payer: Self-pay | Admitting: Nurse Practitioner

## 2019-11-18 ENCOUNTER — Ambulatory Visit: Payer: 59 | Admitting: Nurse Practitioner

## 2019-11-18 ENCOUNTER — Other Ambulatory Visit: Payer: Self-pay

## 2019-11-18 VITALS — BP 122/80 | HR 100 | Temp 97.9°F | Ht 68.2 in | Wt 261.6 lb

## 2019-11-18 DIAGNOSIS — E6609 Other obesity due to excess calories: Secondary | ICD-10-CM | POA: Diagnosis not present

## 2019-11-18 DIAGNOSIS — Z6839 Body mass index (BMI) 39.0-39.9, adult: Secondary | ICD-10-CM | POA: Diagnosis not present

## 2019-11-18 MED ORDER — PHENTERMINE HCL 15 MG PO CAPS: 15.0000 mg | ORAL_CAPSULE | ORAL | 1 refills | Status: DC

## 2019-11-18 NOTE — Progress Notes (Signed)
This visit occurred during the SARS-CoV-2 public health emergency.  Safety protocols were in place, including screening questions prior to the visit, additional usage of staff PPE, and extensive cleaning of exam room while observing appropriate contact time as indicated for disinfecting solutions.  Subjective:     Patient ID: Lorraine Evans , female    DOB: 01-01-1966 , 54 y.o.   MRN: [id number]   Chief Complaint  Patient presents with  . Weight Check    HPI  Here for weight check.    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     Past Medical History:  Diagnosis Date  . Anemia   . H/O varicella   . History of measles      Family History  Problem Relation Age of Onset  . Hypertension Mother      Current Outpatient Medications:  .  Cholecalciferol (VITAMIN D PO), Take 1 capsule by mouth daily., Disp: , Rfl:  .  Iron, Ferrous Sulfate, 325 (65 Fe) MG TABS, Take 1 tablet by mouth daily., Disp: 30 tablet, Rfl: 3 .  Multiple Vitamin (MULTIVITAMIN WITH MINERALS) TABS tablet, Take 1 tablet by mouth daily., Disp: , Rfl:  .  Multiple Vitamins-Minerals (ZINC PO), Take by mouth. Take one tablet daily, Disp: , Rfl:  .  vitamin C (ASCORBIC ACID) 500 MG tablet, Take 500 mg by mouth daily., Disp: , Rfl:    No Known Allergies   Review of Systems   Today's Vitals   11/18/19 1116  BP: 122/80  Pulse: 100  Temp: 97.9 F (36.6 C)  TempSrc: Oral  Weight: 261 lb 9.6 oz (118.7 kg)  Height: 5' 8.2" (1.732 m)  PainSc: 0-No pain   Body mass index is 39.54 kg/m.   Objective:  Physical Exam Vitals reviewed.  Constitutional:      General: She is not in acute distress.    Appearance: Normal appearance.  Cardiovascular:     Rate and Rhythm: Normal rate and regular rhythm.     Pulses: Normal pulses.     Heart sounds: Normal heart sounds. No murmur.  Pulmonary:     Effort: Pulmonary effort is normal. No respiratory distress.  Skin:    General: Skin is warm and dry.     Capillary Refill: Capillary refill takes less than 2 seconds.     Coloration: Skin is not jaundiced.  Neurological:     General: No focal deficit present.     Mental Status: She is alert and oriented to person, place, and time.     Cranial Nerves: No cranial nerve deficit.  Psychiatric:        Mood and Affect: Mood normal.        Behavior: Behavior normal.        Thought Content: Thought content normal.        Judgment: Judgment normal.         Assessment And Plan:     1. Class 2 obesity due to excess calories without serious comorbidity with body mass index (BMI) of 39.0 to 39.9 in adult  She has maintained her weight the last 3 months.  Discussed the importance of regular physical activity.  Return in 2 months for weight check  ASK- she is here for weight loss as a scheduled appt.  ASSESS - Body mass index is 39.54 kg/m., well tolerated, WAIST CIRCUMFERENCE 47.25.  ADVISE  - on risk of cardiovascular disease currently has a diagnosis of hypertension, on medications. AGREE - to increase her physical activity of walking and to have a goal of losing 1-2 lbs per day, encouraged to avoid unrealistic weight loss goals. ASSIST - Rx for phentermine sent to the pharmacy. feels barriers are eating a heavy meal in the morning after getting off in the morning.    - phentermine 15 MG capsule; Take 1 capsule (15 mg total) by mouth every morning.  Dispense: 30 capsule; Refill: 1   [name], FNP    THE PATIENT IS ENCOURAGED TO PRACTICE SOCIAL DISTANCING DUE TO THE COVID-19 PANDEMIC.

## 2020-01-18 ENCOUNTER — Ambulatory Visit: Payer: 59 | Admitting: Internal Medicine

## 2020-02-14 ENCOUNTER — Ambulatory Visit: Payer: 59 | Admitting: Nurse Practitioner

## 2020-02-14 ENCOUNTER — Encounter: Payer: Self-pay | Admitting: Nurse Practitioner

## 2020-02-14 ENCOUNTER — Other Ambulatory Visit: Payer: Self-pay

## 2020-02-14 VITALS — BP 124/80 | HR 61 | Temp 98.1°F | Ht 68.2 in | Wt 247.6 lb

## 2020-02-14 DIAGNOSIS — E6609 Other obesity due to excess calories: Secondary | ICD-10-CM | POA: Diagnosis not present

## 2020-02-14 DIAGNOSIS — Z6837 Body mass index (BMI) 37.0-37.9, adult: Secondary | ICD-10-CM | POA: Diagnosis not present

## 2020-02-14 DIAGNOSIS — E7800 Pure hypercholesterolemia, unspecified: Secondary | ICD-10-CM | POA: Diagnosis not present

## 2020-02-14 DIAGNOSIS — D649 Anemia, unspecified: Secondary | ICD-10-CM

## 2020-02-14 MED ORDER — PHENTERMINE HCL 37.5 MG PO CAPS: 37.5000 mg | ORAL_CAPSULE | ORAL | 1 refills | Status: DC

## 2020-02-14 NOTE — Patient Instructions (Signed)
Patient should walk 20 minutes a day 3-4 days a week, goal weight loss 10-12 pounds Exercising to Lose Weight Exercise is structured, repetitive physical activity to improve fitness and health. Getting regular exercise is important for everyone. It is especially important if you are overweight. Being overweight increases your risk of heart disease, stroke, diabetes, high blood pressure, and several types of cancer. Reducing your calorie intake and exercising can help you lose weight. Exercise is usually categorized as moderate or vigorous intensity. To lose weight, most people need to do a certain amount of moderate-intensity or vigorous-intensity exercise each week. Moderate-intensity exercise  Moderate-intensity exercise is any activity that gets you moving enough to burn at least three times more energy (calories) than if you were sitting. Examples of moderate exercise include:  Walking a mile in 15 minutes.  Doing light yard work.  Biking at an easy pace. Most people should get at least 150 minutes (2 hours and 30 minutes) a week of moderate-intensity exercise to maintain their body weight. Vigorous-intensity exercise Vigorous-intensity exercise is any activity that gets you moving enough to burn at least six times more calories than if you were sitting. When you exercise at this intensity, you should be working hard enough that you are not able to carry on a conversation. Examples of vigorous exercise include:  Running.  Playing a team sport, such as football, basketball, and soccer.  Jumping rope. Most people should get at least 75 minutes (1 hour and 15 minutes) a week of vigorous-intensity exercise to maintain their body weight. How can exercise affect me? When you exercise enough to burn more calories than you eat, you lose weight. Exercise also reduces body fat and builds muscle. The more muscle you have, the more calories you burn. Exercise also:  Improves mood.  Reduces  stress and tension.  Improves your overall fitness, flexibility, and endurance.  Increases bone strength. The amount of exercise you need to lose weight depends on:  Your age.  The type of exercise.  Any health conditions you have.  Your overall physical ability. Talk to your health care provider about how much exercise you need and what types of activities are safe for you. What actions can I take to lose weight? Nutrition   Make changes to your diet as told by your health care provider or diet and nutrition specialist (dietitian). This may include: ? Eating fewer calories. ? Eating more protein. ? Eating less unhealthy fats. ? Eating a diet that includes fresh fruits and vegetables, whole grains, low-fat dairy products, and lean protein. ? Avoiding foods with added fat, salt, and sugar.  Drink plenty of water while you exercise to prevent dehydration or heat stroke. Activity  Choose an activity that you enjoy and set realistic goals. Your health care provider can help you make an exercise plan that works for you.  Exercise at a moderate or vigorous intensity most days of the week. ? The intensity of exercise may vary from person to person. You can tell how intense a workout is for you by paying attention to your breathing and heartbeat. Most people will notice their breathing and heartbeat get faster with more intense exercise.  Do resistance training twice each week, such as: ? Push-ups. ? Sit-ups. ? Lifting weights. ? Using resistance bands.  Getting short amounts of exercise can be just as helpful as long structured periods of exercise. If you have trouble finding time to exercise, try to include exercise in your daily routine. ?  Get up, stretch, and walk around every 30 minutes throughout the day. ? Go for a walk during your lunch break. ? Park your car farther away from your destination. ? If you take public transportation, get off one stop early and walk the rest  of the way. ? Make phone calls while standing up and walking around. ? Take the stairs instead of elevators or escalators.  Wear comfortable clothes and shoes with good support.  Do not exercise so much that you hurt yourself, feel dizzy, or get very short of breath. Where to find more information  [place] Department of Health and Human Services: ThisPath.fi  Centers for Disease Control and Prevention (CDC): FootballExhibition.com.br Contact a health care provider:  Before starting a new exercise program.  If you have questions or concerns about your weight.  If you have a medical problem that keeps you from exercising. Get help right away if you have any of the following while exercising:  Injury.  Dizziness.  Difficulty breathing or shortness of breath that does not go away when you stop exercising.  Chest pain.  Rapid heartbeat. Summary  Being overweight increases your risk of heart disease, stroke, diabetes, high blood pressure, and several types of cancer.  Losing weight happens when you burn more calories than you eat.  Reducing the amount of calories you eat in addition to getting regular moderate or vigorous exercise each week helps you lose weight. This information is not intended to replace advice given to you by your health care provider. Make sure you discuss any questions you have with your health care provider. Document Revised: 07/21/2017 Document Reviewed: 07/21/2017 Elsevier Patient Education  2020 [employer].

## 2020-02-14 NOTE — Progress Notes (Signed)
Tomasa Hose as a scribe for Arnette Felts, FNP.,have documented all relevant documentation on the behalf of Arnette Felts, FNP,as directed by  Arnette Felts, FNP while in the presence of Arnette Felts, FNP.  This visit occurred during the SARS-CoV-2 public health emergency.  Safety protocols were in place, including screening questions prior to the visit, additional usage of staff PPE, and extensive cleaning of exam room while observing appropriate contact time as indicated for disinfecting solutions.  Subjective:     Patient ID: Lorraine Evans , female    DOB: 1965-09-12 , 54 y.o.   MRN: [id number]   Chief Complaint  Patient presents with  . Weight Check    HPI  Patient is here for weight check she is down 14 pounds from April. She states medication phentermine is working it's helping control her appetite. Patient walks at work she drinks about 8 bottles of water a day.   Wt Readings from Last 3 Encounters: 02/14/20 : (!) 247 lb 9.6 oz (112.3 kg) 11/18/19 : 261 lb 9.6 oz (118.7 kg) 08/18/19 : 262 lb (118.8 kg)    Past Medical History:  Diagnosis Date  . Anemia   . H/O varicella   . History of measles      Family History  Problem Relation Age of Onset  . Hypertension Mother      Current Outpatient Medications:  .  Cholecalciferol (VITAMIN D PO), Take 1 capsule by mouth daily., Disp: , Rfl:  .  Iron, Ferrous Sulfate, 325 (65 Fe) MG TABS, Take 1 tablet by mouth daily., Disp: 30 tablet, Rfl: 3 .  Multiple Vitamin (MULTIVITAMIN WITH MINERALS) TABS tablet, Take 1 tablet by mouth daily., Disp: , Rfl:  .  Multiple Vitamins-Minerals (ZINC PO), Take by mouth. Take one tablet daily, Disp: , Rfl:  .  phentermine 37.5 MG capsule, Take 1 capsule (37.5 mg total) by mouth every morning., Disp: 30 capsule, Rfl: 1 .  vitamin C (ASCORBIC ACID) 500 MG tablet, Take 500 mg by mouth daily., Disp: , Rfl:    No Known Allergies   Review of Systems  Constitutional: Negative for  fatigue.  Eyes: Negative.   Respiratory: Negative.   Cardiovascular: Negative.   Endocrine: Negative for polydipsia, polyphagia and polyuria.  Musculoskeletal: Negative.   Neurological: Negative for dizziness, numbness and headaches.  Psychiatric/Behavioral: Negative.      Today's Vitals   02/14/20 1048  BP: 124/80  Pulse: 61  Temp: 98.1 F (36.7 C)  TempSrc: Oral  Weight: (!) 247 lb 9.6 oz (112.3 kg)  Height: 5' 8.2" (1.732 m)  PainSc: 0-No pain   Body mass index is 37.43 kg/m.   Objective:  Physical Exam Vitals reviewed.  Constitutional:      General: She is not in acute distress.    Appearance: Normal appearance. She is obese.  Cardiovascular:     Rate and Rhythm: Normal rate and regular rhythm.     Pulses: Normal pulses.     Heart sounds: Normal heart sounds.  Pulmonary:     Effort: Pulmonary effort is normal. No respiratory distress.     Breath sounds: Normal breath sounds.  Neurological:     General: No focal deficit present.     Mental Status: She is alert and oriented to person, place, and time.     Cranial Nerves: No cranial nerve deficit.  Psychiatric:        Mood and Affect: Mood normal.        Behavior: Behavior normal.  Thought Content: Thought content normal.        Judgment: Judgment normal.         Assessment And Plan:     1. Class 2 obesity due to excess calories without serious comorbidity with body mass index (BMI) of 37.0 to 37.9 in adult Comments: - 14 lb weight loss with phentermine, will increase dose to 37.5 mg  ASK- Given permission to discuss weight today ASSESS - Body mass index is 37.43 kg/m., well tolerated, WAIST CIRCUMFERENCE 47.5in  ADVISE  - on risk of cardiovascular disease  AGREE - to increase her physical activity 20 minutes a day 3-4 times a week of walking back to walking daily to include during lunch time ASSIST - Given advise to walk more, pt stated she has just been lazy  - phentermine 37.5 MG capsule; Take 1  capsule (37.5 mg total) by mouth every morning.  Dispense: 30 capsule; Refill: 1  2. Anemia, unspecified type Comments: Chronic, she is taking daily iron supplement - Ferritin - Iron and IBC ([id number])  3. Elevated cholesterol  Slightly elevated LDL at last visit  Encouraged to avoid fried and fatty foods.   No current medications - Lipid panel   Patient was given opportunity to ask questions. Patient verbalized understanding of the plan and was able to repeat key elements of the plan. All questions were answered to their satisfaction.  Arnette Felts, FNP   I, Arnette Felts, FNP, have reviewed all documentation for this visit. The documentation on 02/14/20 for the exam, diagnosis, procedures, and orders are all accurate and complete.  THE PATIENT IS ENCOURAGED TO PRACTICE SOCIAL DISTANCING DUE TO THE COVID-19 PANDEMIC.

## 2020-02-15 LAB — LIPID PANEL
Chol/HDL Ratio: 3.6 ratio (ref 0.0–4.4)
Cholesterol, Total: 189 mg/dL (ref 100–199)
HDL: 52 mg/dL (ref >39)
LDL Chol Calc (NIH): 120 mg/dL — ABNORMAL HIGH (ref 0–99)
Triglycerides: 93 mg/dL (ref 0–149)
VLDL Cholesterol Cal: 17 mg/dL (ref 5–40)

## 2020-02-15 LAB — IRON AND TIBC
Iron Saturation: 20 % (ref 15–55)
Iron: 59 ug/dL (ref 27–159)
Total Iron Binding Capacity: 289 ug/dL (ref 250–450)
UIBC: 230 ug/dL (ref 131–425)

## 2020-02-15 LAB — FERRITIN: Ferritin: 230 ng/mL — ABNORMAL HIGH (ref 15–150)

## 2020-04-17 ENCOUNTER — Encounter: Payer: Self-pay | Admitting: Nurse Practitioner

## 2020-04-17 ENCOUNTER — Other Ambulatory Visit: Payer: Self-pay

## 2020-04-17 ENCOUNTER — Ambulatory Visit: Payer: 59 | Admitting: Nurse Practitioner

## 2020-04-17 VITALS — BP 122/74 | HR 95 | Temp 98.0°F | Ht 66.0 in | Wt 250.2 lb

## 2020-04-17 DIAGNOSIS — Z6841 Body Mass Index (BMI) 40.0 and over, adult: Secondary | ICD-10-CM

## 2020-04-17 NOTE — Patient Instructions (Addendum)

## 2020-04-17 NOTE — Progress Notes (Signed)
I,Yamilka Roman [employer] as a Neurosurgeon for [employer], FNP.,have documented all relevant documentation on the behalf of Arnette Felts, FNP,as directed by  Arnette Felts, FNP while in the presence of Arnette Felts, FNP.  This visit occurred during the SARS-CoV-2 public health emergency.  Safety protocols were in place, including screening questions prior to the visit, additional usage of staff PPE, and extensive cleaning of exam room while observing appropriate contact time as indicated for disinfecting solutions.  Subjective:     Patient ID: Lorraine Evans , female    DOB: [birth year] , 54 y.o.   MRN: [id number]   Chief Complaint  Patient presents with  . Weight Check    HPI  Patient here for a weight check.  She continues with taking the phentermine. She does admit to eating more out of the vending machine.  She is not exercising due to right knee pain. She is afraid of water.    Wt Readings from Last 3 Encounters: 04/17/20 : 250 lb 3.2 oz (113.5 kg) 02/14/20 : (!) 247 lb 9.6 oz (112.3 kg) 11/18/19 : 261 lb 9.6 oz (118.7 kg)       Past Medical History:  Diagnosis Date  . Anemia   . H/O varicella   . History of measles      Family History  Problem Relation Age of Onset  . Hypertension Mother      Current Outpatient Medications:  .  Cholecalciferol (VITAMIN D PO), Take 1 capsule by mouth daily., Disp: , Rfl:  .  Iron, Ferrous Sulfate, 325 (65 Fe) MG TABS, Take 1 tablet by mouth daily., Disp: 30 tablet, Rfl: 3 .  Multiple Vitamin (MULTIVITAMIN WITH MINERALS) TABS tablet, Take 1 tablet by mouth daily., Disp: , Rfl:  .  Multiple Vitamins-Minerals (ZINC PO), Take by mouth. Take one tablet daily, Disp: , Rfl:  .  phentermine 37.5 MG capsule, Take 1 capsule (37.5 mg total) by mouth every morning., Disp: 30 capsule, Rfl: 1 .  vitamin C (ASCORBIC ACID) 500 MG tablet, Take 500 mg by mouth daily., Disp: , Rfl:    No Known Allergies   Review of Systems  Constitutional:  Negative.   Respiratory: Negative.   Cardiovascular: Negative.  Negative for chest pain, palpitations and leg swelling.  Psychiatric/Behavioral: Negative.      Today's Vitals   04/17/20 1134  BP: 122/74  Pulse: 95  Temp: 98 F (36.7 C)  TempSrc: Oral  Weight: 250 lb 3.2 oz (113.5 kg)  Height: 5\' 6"  (1.676 m)  PainSc: 6   PainLoc: Knee   Body mass index is 40.38 kg/m.   Objective:  Physical Exam Vitals reviewed.  Constitutional:      Appearance: Normal appearance.  Cardiovascular:     Rate and Rhythm: Normal rate.     Pulses: Normal pulses.     Heart sounds: Normal heart sounds. No murmur heard.   Pulmonary:     Effort: Pulmonary effort is normal. No respiratory distress.     Breath sounds: Normal breath sounds.  Neurological:     Mental Status: She is alert.         Assessment And Plan:     1. Class 3 severe obesity due to excess calories with serious comorbidity and body mass index (BMI) of 40.0 to 44.9 in adult [facility])  Chronic, weight has increased by 3 lbs  Discussed getting back on track with her diet  Encouraged to increase physical activity to at least 150 minutes per week  Discussed her lab results during visit. Encouraged to limit intake of fried and fatty foods   Patient was given opportunity to ask questions. Patient verbalized understanding of the plan and was able to repeat key elements of the plan. All questions were answered to their satisfaction.   Jeanell Sparrow, FNP, have reviewed all documentation for this visit. The documentation on 04/30/20 for the exam, diagnosis, procedures, and orders are all accurate and complete.  THE PATIENT IS ENCOURAGED TO PRACTICE SOCIAL DISTANCING DUE TO THE COVID-19 PANDEMIC.

## 2020-04-30 MED ORDER — PHENTERMINE HCL 37.5 MG PO CAPS: 37.5000 mg | ORAL_CAPSULE | ORAL | 1 refills | Status: DC

## 2020-06-19 ENCOUNTER — Ambulatory Visit: Payer: 59 | Admitting: Nurse Practitioner

## 2020-08-21 ENCOUNTER — Other Ambulatory Visit: Payer: Self-pay

## 2020-08-21 ENCOUNTER — Ambulatory Visit: Payer: 59 | Admitting: Nurse Practitioner

## 2020-08-21 ENCOUNTER — Encounter: Payer: Self-pay | Admitting: Nurse Practitioner

## 2020-08-21 VITALS — BP 120/84 | HR 96 | Temp 98.7°F | Ht 66.6 in | Wt 263.4 lb

## 2020-08-21 DIAGNOSIS — E7800 Pure hypercholesterolemia, unspecified: Secondary | ICD-10-CM

## 2020-08-21 DIAGNOSIS — Z0000 Encounter for general adult medical examination without abnormal findings: Secondary | ICD-10-CM | POA: Diagnosis not present

## 2020-08-21 DIAGNOSIS — Z6841 Body Mass Index (BMI) 40.0 and over, adult: Secondary | ICD-10-CM

## 2020-08-21 DIAGNOSIS — D649 Anemia, unspecified: Secondary | ICD-10-CM | POA: Diagnosis not present

## 2020-08-21 MED ORDER — PHENTERMINE HCL 37.5 MG PO CAPS: 37.5000 mg | ORAL_CAPSULE | ORAL | 1 refills | Status: DC

## 2020-08-21 NOTE — Patient Instructions (Addendum)
Health Maintenance, Female Adopting a healthy lifestyle and getting preventive care are important in promoting health and wellness. Ask your health care provider about:  The right schedule for you to have regular tests and exams.  Things you can do on your own to prevent diseases and keep yourself healthy. What should I know about diet, weight, and exercise? Eat a healthy diet  Eat a diet that includes plenty of vegetables, fruits, low-fat dairy products, and lean protein.  Do not eat a lot of foods that are high in solid fats, added sugars, or sodium.   Maintain a healthy weight Body mass index (BMI) is used to identify weight problems. It estimates body fat based on height and weight. Your health care provider can help determine your BMI and help you achieve or maintain a healthy weight. Get regular exercise Get regular exercise. This is one of the most important things you can do for your health. Most adults should:  Exercise for at least 150 minutes each week. The exercise should increase your heart rate and make you sweat (moderate-intensity exercise).  Do strengthening exercises at least twice a week. This is in addition to the moderate-intensity exercise.  Spend less time sitting. Even light physical activity can be beneficial. Watch cholesterol and blood lipids Have your blood tested for lipids and cholesterol at 55 years of age, then have this test every 5 years. Have your cholesterol levels checked more often if:  Your lipid or cholesterol levels are high.  You are older than 55 years of age.  You are at high risk for heart disease. What should I know about cancer screening? Depending on your health history and family history, you may need to have cancer screening at various ages. This may include screening for:  Breast cancer.  Cervical cancer.  Colorectal cancer.  Skin cancer.  Lung cancer. What should I know about heart disease, diabetes, and high blood  pressure? Blood pressure and heart disease  High blood pressure causes heart disease and increases the risk of stroke. This is more likely to develop in people who have high blood pressure readings, are of African descent, or are overweight.  Have your blood pressure checked: ? Every 3-5 years if you are 59-59 years of age. ? Every year if you are 63 years old or older. Diabetes Have regular diabetes screenings. This checks your fasting blood sugar level. Have the screening done:  Once every three years after age 58 if you are at a normal weight and have a low risk for diabetes.  More often and at a younger age if you are overweight or have a high risk for diabetes. What should I know about preventing infection? Hepatitis B If you have a higher risk for hepatitis B, you should be screened for this virus. Talk with your health care provider to find out if you are at risk for hepatitis B infection. Hepatitis C Testing is recommended for:  Everyone born from [birth year] through 1965.  Anyone with known risk factors for hepatitis C. Sexually transmitted infections (STIs)  Get screened for STIs, including gonorrhea and chlamydia, if: ? You are sexually active and are younger than 55 years of age. ? You are older than 55 years of age and your health care provider tells you that you are at risk for this type of infection. ? Your sexual activity has changed since you were last screened, and you are at increased risk for chlamydia or gonorrhea. Ask your health care provider  if you are at risk.  Ask your health care provider about whether you are at high risk for HIV. Your health care provider may recommend a prescription medicine to help prevent HIV infection. If you choose to take medicine to prevent HIV, you should first get tested for HIV. You should then be tested every 3 months for as long as you are taking the medicine. Pregnancy  If you are about to stop having your period (premenopausal) and  you may become pregnant, seek counseling before you get pregnant.  Take 400 to 800 micrograms (mcg) of folic acid every day if you become pregnant.  Ask for birth control (contraception) if you want to prevent pregnancy. Osteoporosis and menopause Osteoporosis is a disease in which the bones lose minerals and strength with aging. This can result in bone fractures. If you are 105 years old or older, or if you are at risk for osteoporosis and fractures, ask your health care provider if you should:  Be screened for bone loss.  Take a calcium or vitamin D supplement to lower your risk of fractures.  Be given hormone replacement therapy (HRT) to treat symptoms of menopause. Follow these instructions at home: Lifestyle  Do not use any products that contain nicotine or tobacco, such as cigarettes, e-cigarettes, and chewing tobacco. If you need help quitting, ask your health care provider.  Do not use street drugs.  Do not share needles.  Ask your health care provider for help if you need support or information about quitting drugs. Alcohol use  Do not drink alcohol if: ? Your health care provider tells you not to drink. ? You are pregnant, may be pregnant, or are planning to become pregnant.  If you drink alcohol: ? Limit how much you use to 0-1 drink a day. ? Limit intake if you are breastfeeding.  Be aware of how much alcohol is in your drink. In the [place] one drink equals one 12 oz bottle of beer (355 mL), one 5 oz glass of wine (148 mL), or one 1 oz glass of hard liquor (44 mL). General instructions  Schedule regular health, dental, and eye exams.  Stay current with your vaccines.  Tell your health care provider if: ? You often feel depressed. ? You have ever been abused or do not feel safe at home. Summary  Adopting a healthy lifestyle and getting preventive care are important in promoting health and wellness.  Follow your health care provider's instructions about healthy  diet, exercising, and getting tested or screened for diseases.  Follow your health care provider's instructions on monitoring your cholesterol and blood pressure. This information is not intended to replace advice given to you by your health care provider. Make sure you discuss any questions you have with your health care provider. Document Revised: 07/01/2018 Document Reviewed: 07/01/2018 Elsevier Patient Education  2021 [place] Following a healthy eating pattern may help you to achieve and maintain a healthy body weight, reduce the risk of chronic disease, and live a long and productive life. It is important to follow a healthy eating pattern at an appropriate calorie level for your body. Your nutritional needs should be met primarily through food by choosing a variety of nutrient-rich foods. What are tips for following this plan? Reading food labels  Read labels and choose the following: ? Reduced or low sodium. ? Juices with 100% fruit juice. ? Foods with low saturated fats and high polyunsaturated and monounsaturated fats. ? Foods  with whole grains, such as whole wheat, cracked wheat, brown rice, and wild rice. ? Whole grains that are fortified with folic acid. This is recommended for women who are pregnant or who want to become pregnant.  Read labels and avoid the following: ? Foods with a lot of added sugars. These include foods that contain brown sugar, corn sweetener, corn syrup, dextrose, fructose, glucose, high-fructose corn syrup, honey, invert sugar, lactose, malt syrup, maltose, molasses, raw sugar, sucrose, trehalose, or turbinado sugar.  Do not eat more than the following amounts of added sugar per day:  6 teaspoons (25 g) for women.  9 teaspoons (38 g) for men. ? Foods that contain processed or refined starches and grains. ? Refined grain products, such as white flour, degermed cornmeal, white bread, and white rice. Shopping  Choose  nutrient-rich snacks, such as vegetables, whole fruits, and nuts. Avoid high-calorie and high-sugar snacks, such as potato chips, fruit snacks, and candy.  Use oil-based dressings and spreads on foods instead of solid fats such as butter, stick margarine, or cream cheese.  Limit pre-made sauces, mixes, and "instant" products such as flavored rice, instant noodles, and ready-made pasta.  Try more plant-protein sources, such as tofu, tempeh, black beans, edamame, lentils, nuts, and seeds.  Explore eating plans such as the Mediterranean diet or vegetarian diet. Cooking  Use oil to saut or stir-fry foods instead of solid fats such as butter, stick margarine, or lard.  Try baking, boiling, grilling, or broiling instead of frying.  Remove the fatty part of meats before cooking.  Steam vegetables in water or broth. Meal planning  At meals, imagine dividing your plate into fourths: ? One-half of your plate is fruits and vegetables. ? One-fourth of your plate is whole grains. ? One-fourth of your plate is protein, especially lean meats, poultry, eggs, tofu, beans, or nuts.  Include low-fat dairy as part of your daily diet.   Lifestyle  Choose healthy options in all settings, including home, work, school, restaurants, or stores.  Prepare your food safely: ? Wash your hands after handling raw meats. ? Keep food preparation surfaces clean by regularly washing with hot, soapy water. ? Keep raw meats separate from ready-to-eat foods, such as fruits and vegetables. ? Cook seafood, meat, poultry, and eggs to the recommended internal temperature. ? Store foods at safe temperatures. In general:  Keep cold foods at 59F (4.4C) or below.  Keep hot foods at 159F (60C) or above.  Keep your freezer at [facility] (-17.8C) or below.  Foods are no longer safe to eat when they have been between the temperatures of 40-159F (4.4-60C) for more than 2 hours. What foods should I eat? Fruits Aim to eat  2 cup-equivalents of fresh, canned (in natural juice), or frozen fruits each day. Examples of 1 cup-equivalent of fruit include 1 small apple, 8 large strawberries, 1 cup canned fruit,  cup dried fruit, or 1 cup 100% juice. Vegetables Aim to eat 2-3 cup-equivalents of fresh and frozen vegetables each day, including different varieties and colors. Examples of 1 cup-equivalent of vegetables include 2 medium carrots, 2 cups raw, leafy greens, 1 cup chopped vegetable (raw or cooked), or 1 medium baked potato. Grains Aim to eat 6 ounce-equivalents of whole grains each day. Examples of 1 ounce-equivalent of grains include 1 slice of bread, 1 cup ready-to-eat cereal, 3 cups popcorn, or  cup cooked rice, pasta, or cereal. Meats and other proteins Aim to eat 5-6 ounce-equivalents of protein each day. Examples of  1 ounce-equivalent of protein include 1 egg, 1/2 cup nuts or seeds, or 1 tablespoon (16 g) peanut butter. A cut of meat or fish that is the size of a deck of cards is about 3-4 ounce-equivalents.  Of the protein you eat each week, try to have at least 8 ounces come from seafood. This includes salmon, trout, herring, and anchovies. Dairy Aim to eat 3 cup-equivalents of fat-free or low-fat dairy each day. Examples of 1 cup-equivalent of dairy include 1 cup (240 mL) milk, 8 ounces (250 g) yogurt, 1 ounces (44 g) natural cheese, or 1 cup (240 mL) fortified soy milk. Fats and oils  Aim for about 5 teaspoons (21 g) per day. Choose monounsaturated fats, such as canola and olive oils, avocados, peanut butter, and most nuts, or polyunsaturated fats, such as sunflower, corn, and soybean oils, walnuts, pine nuts, sesame seeds, sunflower seeds, and flaxseed. Beverages  Aim for six 8-oz glasses of water per day. Limit coffee to three to five 8-oz cups per day.  Limit caffeinated beverages that have added calories, such as soda and energy drinks.  Limit alcohol intake to no more than 1 drink a day for  nonpregnant women and 2 drinks a day for men. One drink equals 12 oz of beer (355 mL), 5 oz of wine (148 mL), or 1 oz of hard liquor (44 mL). Seasoning and other foods  Avoid adding excess amounts of salt to your foods. Try flavoring foods with herbs and spices instead of salt.  Avoid adding sugar to foods.  Try using oil-based dressings, sauces, and spreads instead of solid fats. This information is based on general [place] nutrition guidelines. For more information, visit [employer]. Exact amounts may vary based on your nutrition needs. Summary  A healthy eating plan may help you to maintain a healthy weight, reduce the risk of chronic diseases, and stay active throughout your life.  Plan your meals. Make sure you eat the right portions of a variety of nutrient-rich foods.  Try baking, boiling, grilling, or broiling instead of frying.  Choose healthy options in all settings, including home, work, school, restaurants, or stores. This information is not intended to replace advice given to you by your health care provider. Make sure you discuss any questions you have with your health care provider. Document Revised: 10/20/2017 Document Reviewed: 10/20/2017 Elsevier Patient Education  [place].

## 2020-08-21 NOTE — Progress Notes (Signed)
I,Yamilka Roman [employer] as a Education administrator for [employer], FNP.,have documented all relevant documentation on the behalf of [name], FNP,as directed by  [name], FNP while in the presence of [name], [place]. This visit occurred during the SARS-CoV-2 public health emergency.  Safety protocols were in place, including screening questions prior to the visit, additional usage of staff PPE, and extensive cleaning of exam room while observing appropriate contact time as indicated for disinfecting solutions.  Subjective:     Patient ID: Lorraine Evans , female    DOB: 01/25/1966 , 56 y.o.   MRN: [id number]   Chief Complaint  Patient presents with  . Annual Exam  . Obesity    HPI  Here for hm. Her father passed away since her last visit.  She stopped taking phentermine and not eating as well.   Wt Readings from Last 3 Encounters: 08/21/20 : 263 lb 6.4 oz (119.5 kg) 04/17/20 : 250 lb 3.2 oz (113.5 kg) 02/14/20 : (!) 247 lb 9.6 oz (112.3 kg)    Past Medical History:  Diagnosis Date  . Anemia   . H/O varicella   . History of measles      Family History  Problem Relation Age of Onset  . Hypertension Mother      Current Outpatient Medications:  .  Cholecalciferol (VITAMIN D PO), Take 1 capsule by mouth daily., Disp: , Rfl:  .  Iron, Ferrous Sulfate, 325 (65 Fe) MG TABS, Take 1 tablet by mouth daily., Disp: 30 tablet, Rfl: 3 .  Multiple Vitamin (MULTIVITAMIN WITH MINERALS) TABS tablet, Take 1 tablet by mouth daily., Disp: , Rfl:  .  Multiple Vitamins-Minerals (ZINC PO), Take by mouth. Take one tablet daily, Disp: , Rfl:  .  vitamin C (ASCORBIC ACID) 500 MG tablet, Take 500 mg by mouth daily., Disp: , Rfl:  .  phentermine 37.5 MG capsule, Take 1 capsule (37.5 mg total) by mouth every morning., Disp: 30 capsule, Rfl: 1   No Known Allergies    The patient states she uses none for birth control.  No LMP recorded.. Negative for Dysmenorrhea and Negative for Menorrhagia.  Negative for: breast discharge, breast lump(s), breast pain and breast self exam. Associated symptoms include abnormal vaginal bleeding. Pertinent negatives include abnormal bleeding (hematology), anxiety, decreased libido, depression, difficulty falling sleep, dyspareunia, history of infertility, nocturia, sexual dysfunction, sleep disturbances, urinary incontinence, urinary urgency, vaginal discharge and vaginal itching. Diet regular; she admits to eating more junk food. The patient states her exercise level is none since her father passed.   The patient's tobacco use is:  Social History   Tobacco Use  Smoking Status Never Smoker  Smokeless Tobacco Never Used   She has been exposed to passive smoke. The patient's alcohol use is:  Social History   Substance and Sexual Activity  Alcohol Use No   Additional information: Last pap 2019, next one scheduled for 2022.    Review of Systems  Constitutional: Negative.   HENT: Negative.   Eyes: Negative.   Respiratory: Negative.   Cardiovascular: Negative.   Gastrointestinal: Negative.   Endocrine: Negative.   Genitourinary: Negative.   Musculoskeletal: Negative.   Skin: Negative.   Allergic/Immunologic: Negative.   Neurological: Negative.   Hematological: Negative.   Psychiatric/Behavioral: Negative.      Today's Vitals   08/21/20 1051  BP: 120/84  Pulse: 96  Temp: 98.7 F (37.1 C)  TempSrc: Oral  Weight: 263 lb 6.4 oz (119.5 kg)  Height: 5' 6.6" (  1.692 m)  PainSc: 0-No pain   Body mass index is 41.75 kg/m.   Objective:  Physical Exam Constitutional:      General: She is not in acute distress.    Appearance: Normal appearance. She is well-developed. She is obese.  HENT:     Head: Normocephalic and atraumatic.     Right Ear: Hearing, tympanic membrane, ear canal and external ear normal. There is no impacted cerumen.     Left Ear: Hearing, tympanic membrane, ear canal and external ear normal. There is no impacted cerumen.      Nose:     Comments: Deferred - masked    Mouth/Throat:     Comments: Deferred - masked Eyes:     General: Lids are normal.     Extraocular Movements: Extraocular movements intact.     Conjunctiva/sclera: Conjunctivae normal.     Pupils: Pupils are equal, round, and reactive to light.     Funduscopic exam:    Right eye: No papilledema.        Left eye: No papilledema.  Neck:     Thyroid: No thyroid mass.     Vascular: No carotid bruit.  Cardiovascular:     Rate and Rhythm: Normal rate and regular rhythm.     Pulses: Normal pulses.     Heart sounds: Normal heart sounds. No murmur heard.   Pulmonary:     Effort: Pulmonary effort is normal.     Breath sounds: Normal breath sounds.  Chest:     Chest wall: No mass.  Breasts:     Tanner Score is 5.     Right: Normal. No mass, tenderness, axillary adenopathy or supraclavicular adenopathy.     Left: Normal. No mass, tenderness, axillary adenopathy or supraclavicular adenopathy.    Abdominal:     General: Abdomen is flat. Bowel sounds are normal. There is no distension.     Palpations: Abdomen is soft.     Tenderness: There is no abdominal tenderness.  Genitourinary:    Rectum: Guaiac result negative.  Musculoskeletal:        General: No swelling. Normal range of motion.     Cervical back: Full passive range of motion without pain, normal range of motion and neck supple.     Right lower leg: No edema.     Left lower leg: No edema.  Lymphadenopathy:     Upper Body:     Right upper body: No supraclavicular, axillary or pectoral adenopathy.     Left upper body: No supraclavicular, axillary or pectoral adenopathy.  Skin:    General: Skin is warm and dry.     Capillary Refill: Capillary refill takes less than 2 seconds.  Neurological:     General: No focal deficit present.     Mental Status: She is alert and oriented to person, place, and time.     Cranial Nerves: No cranial nerve deficit.     Sensory: No sensory deficit.   Psychiatric:        Mood and Affect: Mood normal.        Behavior: Behavior normal.        Thought Content: Thought content normal.        Judgment: Judgment normal.         Assessment And Plan:     1. Encounter for general adult medical examination w/o abnormal findings . Behavior modifications discussed and diet history reviewed.   . Pt will continue to exercise regularly and modify diet with  low GI, plant based foods and decrease intake of processed foods.  . Recommend intake of daily multivitamin, Vitamin D, and calcium.  . Recommend mammogram and colonoscopy (up to date) for preventive screenings, as well as recommend immunizations that include influenza, TDAP (both are up to date) - CMP14+EGFR - CBC  2. Class 3 severe obesity due to excess calories with serious comorbidity and body mass index (BMI) of 40.0 to 44.9 in adult [facility])  She has gained 13 lbs since her last visit, she stopped taking her phentermine 37.5 mg daily until 10 days ago and has tolerated well  I will restart her phentermine and she is to return in 2 months for weight check  She is encouraged to increase her physical activity to at least 5 days a week of 30 minutes per day - phentermine 37.5 MG capsule; Take 1 capsule (37.5 mg total) by mouth every morning.  Dispense: 30 capsule; Refill: 1  3. Elevated cholesterol  Chronic, controlled  No current medications  Encouraged to avoid fried and fatty foods - Lipid panel  4. Anemia, unspecified type  Chronic, she is taking an over the counter iron supplement and tolerating well - Hemoglobin A1c - Iron, TIBC and Ferritin Panel     Patient was given opportunity to ask questions. Patient verbalized understanding of the plan and was able to repeat key elements of the plan. All questions were answered to their satisfaction.   [name], FNP    I, [name], FNP, have reviewed all documentation for this visit. The documentation on 08/21/20 for the  exam, diagnosis, procedures, and orders are all accurate and complete.   THE PATIENT IS ENCOURAGED TO PRACTICE SOCIAL DISTANCING DUE TO THE COVID-19 PANDEMIC.

## 2020-08-22 LAB — LIPID PANEL
Chol/HDL Ratio: 4 ratio (ref 0.0–4.4)
Cholesterol, Total: 245 mg/dL — ABNORMAL HIGH (ref 100–199)
HDL: 61 mg/dL (ref >39)
LDL Chol Calc (NIH): 166 mg/dL — ABNORMAL HIGH (ref 0–99)
Triglycerides: 105 mg/dL (ref 0–149)
VLDL Cholesterol Cal: 18 mg/dL (ref 5–40)

## 2020-08-22 LAB — IRON,TIBC AND FERRITIN PANEL
Ferritin: 320 ng/mL — ABNORMAL HIGH (ref 15–150)
Iron Saturation: 17 % (ref 15–55)
Iron: 61 ug/dL (ref 27–159)
Total Iron Binding Capacity: 363 ug/dL (ref 250–450)
UIBC: 302 ug/dL (ref 131–425)

## 2020-08-22 LAB — CBC
Hematocrit: 42.6 % (ref 34.0–46.6)
Hemoglobin: 14.3 g/dL (ref 11.1–15.9)
MCH: 28.4 pg (ref 26.6–33.0)
MCHC: 33.6 g/dL (ref 31.5–35.7)
MCV: 85 fL (ref 79–97)
Platelets: 304 10*3/uL (ref 150–450)
RBC: 5.04 x10E6/uL (ref 3.77–5.28)
RDW: 13.8 % (ref 11.7–15.4)
WBC: 8.4 10*3/uL (ref 3.4–10.8)

## 2020-08-22 LAB — HEMOGLOBIN A1C
Est. average glucose Bld gHb Est-mCnc: 105 mg/dL
Hgb A1c MFr Bld: 5.3 % (ref 4.8–5.6)

## 2020-08-22 LAB — CMP14+EGFR
ALT: 17 IU/L (ref 0–32)
AST: 28 IU/L (ref 0–40)
Albumin/Globulin Ratio: 1.2 (ref 1.2–2.2)
Albumin: 4.7 g/dL (ref 3.8–4.9)
Alkaline Phosphatase: 99 IU/L (ref 44–121)
BUN/Creatinine Ratio: 26 — ABNORMAL HIGH (ref 9–23)
BUN: 20 mg/dL (ref 6–24)
Bilirubin Total: 0.3 mg/dL (ref 0.0–1.2)
CO2: 21 mmol/L (ref 20–29)
Calcium: 10.6 mg/dL — ABNORMAL HIGH (ref 8.7–10.2)
Chloride: 99 mmol/L (ref 96–106)
Creatinine, Ser: 0.78 mg/dL (ref 0.57–1.00)
GFR calc Af Amer: 100 mL/min/{1.73_m2} (ref >59)
GFR calc non Af Amer: 86 mL/min/{1.73_m2} (ref >59)
Globulin, Total: 3.9 g/dL (ref 1.5–4.5)
Glucose: 88 mg/dL (ref 65–99)
Potassium: 4.5 mmol/L (ref 3.5–5.2)
Sodium: 142 mmol/L (ref 134–144)
Total Protein: 8.6 g/dL — ABNORMAL HIGH (ref 6.0–8.5)

## 2020-10-23 ENCOUNTER — Encounter: Payer: Self-pay | Admitting: Nurse Practitioner

## 2020-10-23 ENCOUNTER — Other Ambulatory Visit: Payer: Self-pay | Admitting: Nurse Practitioner

## 2020-10-23 ENCOUNTER — Ambulatory Visit: Payer: 59 | Admitting: Nurse Practitioner

## 2020-10-23 ENCOUNTER — Other Ambulatory Visit: Payer: Self-pay

## 2020-10-23 VITALS — BP 134/90 | HR 105 | Temp 98.1°F | Ht 66.6 in | Wt 256.8 lb

## 2020-10-23 DIAGNOSIS — R030 Elevated blood-pressure reading, without diagnosis of hypertension: Secondary | ICD-10-CM | POA: Diagnosis not present

## 2020-10-23 DIAGNOSIS — Z6841 Body Mass Index (BMI) 40.0 and over, adult: Secondary | ICD-10-CM | POA: Diagnosis not present

## 2020-10-23 DIAGNOSIS — Z1231 Encounter for screening mammogram for malignant neoplasm of breast: Secondary | ICD-10-CM

## 2020-10-23 NOTE — Progress Notes (Signed)
I,Yamilka Roman [employer] as a Neurosurgeon for [employer], FNP.,have documented all relevant documentation on the behalf of Arnette Felts, FNP,as directed by  Arnette Felts, FNP while in the presence of Arnette Felts, FNP.  This visit occurred during the SARS-CoV-2 public health emergency.  Safety protocols were in place, including screening questions prior to the visit, additional usage of staff PPE, and extensive cleaning of exam room while observing appropriate contact time as indicated for disinfecting solutions.  Subjective:     Patient ID: Lorraine Evans , female    DOB: 11-Jul-1966 , 55 y.o.   MRN: [id number]   Chief Complaint  Patient presents with  . Weight Check    HPI  Patient presents today for a weight check. She is tolerating phentermine well. She is not exercising regularly. She has cut back on her portion size. Denies any side effects. She is drinking approximately 6 cups of water daily.   Wt Readings from Last 3 Encounters: 10/23/20 : 256 lb 12.8 oz (116.5 kg) 08/21/20 : 263 lb 6.4 oz (119.5 kg) 04/17/20 : 250 lb 3.2 oz (113.5 kg)    Past Medical History:  Diagnosis Date  . Anemia   . H/O varicella   . History of measles      Family History  Problem Relation Age of Onset  . Hypertension Mother      Current Outpatient Medications:  .  Cholecalciferol (VITAMIN D PO), Take 1 capsule by mouth daily., Disp: , Rfl:  .  Iron, Ferrous Sulfate, 325 (65 Fe) MG TABS, Take 1 tablet by mouth daily., Disp: 30 tablet, Rfl: 3 .  Multiple Vitamin (MULTIVITAMIN WITH MINERALS) TABS tablet, Take 1 tablet by mouth daily., Disp: , Rfl:  .  Multiple Vitamins-Minerals (ZINC PO), Take by mouth. Take one tablet daily, Disp: , Rfl:  .  vitamin C (ASCORBIC ACID) 500 MG tablet, Take 500 mg by mouth daily., Disp: , Rfl:  .  phentermine 37.5 MG capsule, Take 1 capsule (37.5 mg total) by mouth every morning., Disp: 30 capsule, Rfl: 1   No Known Allergies   Review of Systems   Constitutional: Negative.   Respiratory: Negative.   Cardiovascular: Negative.   Psychiatric/Behavioral: Negative.      Today's Vitals   10/23/20 1053  BP: 134/90  Pulse: (!) 105  Temp: 98.1 F (36.7 C)  TempSrc: Oral  Weight: 256 lb 12.8 oz (116.5 kg)  Height: 5' 6.6" (1.692 m)  PainSc: 0-No pain   Body mass index is 40.71 kg/m.   Objective:  Physical Exam Vitals reviewed.  Constitutional:      General: She is not in acute distress.    Appearance: Normal appearance. She is obese.  Cardiovascular:     Rate and Rhythm: Normal rate.     Pulses: Normal pulses.     Heart sounds: Normal heart sounds. No murmur heard.   Pulmonary:     Effort: Pulmonary effort is normal. No respiratory distress.     Breath sounds: Normal breath sounds. No wheezing.  Neurological:     General: No focal deficit present.     Mental Status: She is alert and oriented to person, place, and time.     Cranial Nerves: No cranial nerve deficit.  Psychiatric:        Mood and Affect: Mood normal.        Behavior: Behavior normal.        Thought Content: Thought content normal.        Judgment:  Judgment normal.         Assessment And Plan:     1. Class 3 severe obesity due to excess calories with serious comorbidity and body mass index (BMI) of 40.0 to 44.9 in adult [facility])  Chronic  Discussed healthy diet and regular exercise options   Encouraged to exercise at least 150 minutes per week with 2 days of strength training  Continue with phentermine    Return in 2 months for weight check. - phentermine 37.5 MG capsule; Take 1 capsule (37.5 mg total) by mouth every morning.  Dispense: 30 capsule; Refill: 1  2. Elevated blood-pressure reading without diagnosis of hypertension I have encouraged her to limit salt intake.  She is also encouraged to increase physical activity   Patient was given opportunity to ask questions. Patient verbalized understanding of the plan and was able to repeat  key elements of the plan. All questions were answered to their satisfaction.  Arnette Felts, FNP    I, Arnette Felts, FNP, have reviewed all documentation for this visit. The documentation on 10/23/20 for the exam, diagnosis, procedures, and orders are all accurate and complete.   IF YOU HAVE BEEN REFERRED TO A SPECIALIST, IT MAY TAKE 1-2 WEEKS TO SCHEDULE/PROCESS THE REFERRAL. IF YOU HAVE NOT HEARD FROM US/SPECIALIST IN TWO WEEKS, PLEASE GIVE [place] A CALL AT [phone].   THE PATIENT IS ENCOURAGED TO PRACTICE SOCIAL DISTANCING DUE TO THE COVID-19 PANDEMIC.

## 2020-10-23 NOTE — Patient Instructions (Addendum)

## 2020-10-29 MED ORDER — PHENTERMINE HCL 37.5 MG PO CAPS
37.5000 mg | ORAL_CAPSULE | ORAL | 1 refills | Status: DC
Start: 2020-10-29 — End: 2021-08-27

## 2020-11-09 ENCOUNTER — Ambulatory Visit: Payer: 59

## 2021-01-01 ENCOUNTER — Ambulatory Visit: Payer: 59 | Admitting: Nurse Practitioner

## 2021-08-27 ENCOUNTER — Encounter: Payer: Self-pay | Admitting: Nurse Practitioner

## 2021-08-27 ENCOUNTER — Other Ambulatory Visit: Payer: Self-pay

## 2021-08-27 ENCOUNTER — Ambulatory Visit (INDEPENDENT_AMBULATORY_CARE_PROVIDER_SITE_OTHER): Payer: 59 | Admitting: Nurse Practitioner

## 2021-08-27 VITALS — BP 130/72 | HR 62 | Temp 98.1°F | Ht 66.6 in | Wt 259.4 lb

## 2021-08-27 DIAGNOSIS — Z23 Encounter for immunization: Secondary | ICD-10-CM | POA: Diagnosis not present

## 2021-08-27 DIAGNOSIS — Z0000 Encounter for general adult medical examination without abnormal findings: Secondary | ICD-10-CM

## 2021-08-27 DIAGNOSIS — Z1231 Encounter for screening mammogram for malignant neoplasm of breast: Secondary | ICD-10-CM

## 2021-08-27 DIAGNOSIS — D649 Anemia, unspecified: Secondary | ICD-10-CM

## 2021-08-27 DIAGNOSIS — E7800 Pure hypercholesterolemia, unspecified: Secondary | ICD-10-CM

## 2021-08-27 DIAGNOSIS — M25562 Pain in left knee: Secondary | ICD-10-CM

## 2021-08-27 DIAGNOSIS — Z6841 Body Mass Index (BMI) 40.0 and over, adult: Secondary | ICD-10-CM

## 2021-08-27 MED ORDER — MELOXICAM 7.5 MG PO TABS: ORAL_TABLET | ORAL | 2 refills | Status: DC

## 2021-08-27 NOTE — Progress Notes (Signed)
I,Lorraine Evans,acting as a Education administrator for [employer], FNP.,have documented all relevant documentation on the behalf of Lorraine Brine, FNP,as directed by  Lorraine Brine, FNP while in the presence of Lorraine Evans, Lorraine Evans.  This visit occurred during the SARS-CoV-2 public health emergency.  Safety protocols were in place, including screening questions prior to the visit, additional usage of staff PPE, and extensive cleaning of exam room while observing appropriate contact time as indicated for disinfecting solutions.  Subjective:     Patient ID: Lorraine Evans , female    DOB: 08-Apr-1966 , 56 y.o.   MRN: [id number]   Chief Complaint  Patient presents with   Annual Exam    HPI  Here for hm. Continues to be followed by [facility]. Reports last PAP was 2022. She is now working at night and will eat then go to sleep, she will eat a heavy meal.     Past Medical History:  Diagnosis Date   Anemia    H/O varicella    History of measles      Family History  Problem Relation Age of Onset   Hypertension Mother      Current Outpatient Medications:    Cholecalciferol (VITAMIN D PO), Take 1 capsule by mouth daily., Disp: , Rfl:    Iron, Ferrous Sulfate, 325 (65 Fe) MG TABS, Take 1 tablet by mouth daily., Disp: 30 tablet, Rfl: 3   meloxicam (MOBIC) 7.5 MG tablet, Take 1 tab by mouth x 5 days then daily as needed, Disp: 30 tablet, Rfl: 2   Multiple Vitamin (MULTIVITAMIN WITH MINERALS) TABS tablet, Take 1 tablet by mouth daily., Disp: , Rfl:    Multiple Vitamins-Minerals (ZINC PO), Take by mouth. Take one tablet daily, Disp: , Rfl:    vitamin C (ASCORBIC ACID) 500 MG tablet, Take 500 mg by mouth daily., Disp: , Rfl:    No Known Allergies    The patient states she is post menopausal status.   No LMP recorded (lmp unknown). Patient is postmenopausal.. Negative for Dysmenorrhea and Negative for Menorrhagia. Negative for: breast discharge, breast lump(s), breast pain and breast  self exam. Associated symptoms include abnormal vaginal bleeding. Pertinent negatives include abnormal bleeding (hematology), anxiety, decreased libido, depression, difficulty falling sleep, dyspareunia, history of infertility, nocturia, sexual dysfunction, sleep disturbances, urinary incontinence, urinary urgency, vaginal discharge and vaginal itching. Diet regular. The patient states her exercise level is minimal - walks with her job.    The patient's tobacco use is:  Social History   Tobacco Use  Smoking Status Never  Smokeless Tobacco Never   She has been exposed to passive smoke. The patient's alcohol use is:  Social History   Substance and Sexual Activity  Alcohol Use No   Additional information: Last pap 2022 - per patient, next one scheduled for 2025.    Review of Systems  Constitutional: Negative.   HENT: Negative.    Eyes: Negative.   Respiratory: Negative.    Cardiovascular: Negative.   Gastrointestinal: Negative.   Endocrine: Negative.   Genitourinary: Negative.   Musculoskeletal:  Positive for arthralgias (3-4 months ago - denies fall. Notices when she gains weight she feels the pain ot her left knee.  She is using a pain cream and arthritis type pill - not effective.).       Left knee   Skin: Negative.   Allergic/Immunologic: Negative.   Neurological: Negative.   Hematological: Negative.   Psychiatric/Behavioral: Negative.      Today's Vitals  08/27/21 0825  BP: 130/72  Pulse: 62  Temp: 98.1 F (36.7 C)  TempSrc: Oral  Weight: 259 lb 6.4 oz (117.7 kg)  Height: 5' 6.6" (1.692 m)   Body mass index is 41.12 kg/m.  Wt Readings from Last 3 Encounters:  08/27/21 259 lb 6.4 oz (117.7 kg)  10/23/20 256 lb 12.8 oz (116.5 kg)  08/21/20 263 lb 6.4 oz (119.5 kg)    Objective:  Physical Exam Constitutional:      General: She is not in acute distress.    Appearance: Normal appearance. She is well-developed. She is obese.  HENT:     Head: Normocephalic and  atraumatic.     Right Ear: Hearing, tympanic membrane, ear canal and external ear normal. There is no impacted cerumen.     Left Ear: Hearing, tympanic membrane, ear canal and external ear normal. There is no impacted cerumen.     Nose:     Comments: Deferred - masked    Mouth/Throat:     Comments: Deferred - masked Eyes:     General: Lids are normal.     Extraocular Movements: Extraocular movements intact.     Conjunctiva/sclera: Conjunctivae normal.     Pupils: Pupils are equal, round, and reactive to light.     Funduscopic exam:    Right eye: No papilledema.        Left eye: No papilledema.  Neck:     Thyroid: No thyroid mass.     Vascular: No carotid bruit.  Cardiovascular:     Rate and Rhythm: Normal rate and regular rhythm.     Pulses: Normal pulses.     Heart sounds: Normal heart sounds. No murmur heard. Pulmonary:     Effort: Pulmonary effort is normal. No respiratory distress.     Breath sounds: Normal breath sounds. No wheezing.  Chest:     Chest wall: No mass.  Breasts:    Tanner Score is 5.     Right: Normal. No mass or tenderness.     Left: Normal. No mass or tenderness.  Abdominal:     General: Abdomen is flat. Bowel sounds are normal. There is no distension.     Palpations: Abdomen is soft.     Tenderness: There is no abdominal tenderness.  Musculoskeletal:        General: Tenderness (left calf with palpation and when laying flat) present. No swelling. Normal range of motion.     Cervical back: Full passive range of motion without pain, normal range of motion and neck supple.     Right lower leg: No edema.     Left lower leg: No edema.  Lymphadenopathy:     Upper Body:     Right upper body: No supraclavicular, axillary or pectoral adenopathy.     Left upper body: No supraclavicular, axillary or pectoral adenopathy.  Skin:    General: Skin is warm and dry.     Capillary Refill: Capillary refill takes less than 2 seconds.  Neurological:     General: No  focal deficit present.     Mental Status: She is alert and oriented to person, place, and time.     Cranial Nerves: No cranial nerve deficit.     Sensory: No sensory deficit.     Motor: No weakness.  Psychiatric:        Mood and Affect: Mood normal.        Behavior: Behavior normal.        Thought Content: Thought content  normal.        Judgment: Judgment normal.        Assessment And Plan:     1. Encounter for general adult medical examination w/o abnormal findings Behavior modifications discussed and diet history reviewed.   Pt will continue to exercise regularly and modify diet with low GI, plant based foods and decrease intake of processed foods.  Recommend intake of daily multivitamin, Vitamin D, and calcium.  Recommend mammogram and colonoscopy (up to date) for preventive screenings, as well as recommend immunizations that include influenza (done today) TDAP, and Shingles (will return in 2 weeks for repeat) - CMP14+EGFR  2. Class 3 severe obesity due to excess calories with serious comorbidity and body mass index (BMI) of 40.0 to 44.9 in adult [facility]) Chronic Discussed healthy diet and regular exercise options  Encouraged to exercise at least 150 minutes per week with 2 days of strength training I have encouraged her to start slow with her weight loss Encouraged to park further when at the store, take stairs instead of elevators and to walk in place during commercials. Increase water intake to at least one gallon of water daily. - Hemoglobin A1c  3. Anemia, unspecified type Comments: Continue iron supplement - CBC - Iron, TIBC and Ferritin Panel  4. Elevated cholesterol Comments: Diet controlled. Encouraged to continue low fat diet - Lipid panel  5. Acute pain of left knee Comments: encouraged to lose weight and will treat with meloxicam for 5 days then daily as needed.  - VITAMIN D 25 Hydroxy (Vit-D Deficiency, Fractures)  6. Need for influenza vaccination Influenza  vaccine administered Encouraged to take Tylenol as needed for fever or muscle aches. - Flu Vaccine QUAD 6+ mos PF IM (Fluarix Quad PF) - meloxicam (MOBIC) 7.5 MG tablet; Take 1 tab by mouth x 5 days then daily as needed  Dispense: 30 tablet; Refill: 2  7. Encounter for screening mammogram for breast cancer Pt instructed on Self Breast Exam.According to ACOG guidelines Women aged 34 and older are recommended to get an annual mammogram. Form completed and given to patient contact the [facility] for appointment scheduing.  Pt encouraged to get annual mammogram - MM Digital Screening; Future   Patient was given opportunity to ask questions. Patient verbalized understanding of the plan and was able to repeat key elements of the plan. All questions were answered to their satisfaction.   Lorraine Brine, FNP   I, Lorraine Brine, FNP, have reviewed all documentation for this visit. The documentation on 08/27/21 for the exam, diagnosis, procedures, and orders are all accurate and complete.   THE PATIENT IS ENCOURAGED TO PRACTICE SOCIAL DISTANCING DUE TO THE COVID-19 PANDEMIC.

## 2021-08-27 NOTE — Patient Instructions (Addendum)
Health Maintenance, Female °Adopting a healthy lifestyle and getting preventive care are important in promoting health and wellness. Ask your health care provider about: °The right schedule for you to have regular tests and exams. °Things you can do on your own to prevent diseases and keep yourself healthy. °What should I know about diet, weight, and exercise? °Eat a healthy diet ° °Eat a diet that includes plenty of vegetables, fruits, low-fat dairy products, and lean protein. °Do not eat a lot of foods that are high in solid fats, added sugars, or sodium. °Maintain a healthy weight °Body mass index (BMI) is used to identify weight problems. It estimates body fat based on height and weight. Your health care provider can help determine your BMI and help you achieve or maintain a healthy weight. °Get regular exercise °Get regular exercise. This is one of the most important things you can do for your health. Most adults should: °Exercise for at least 150 minutes each week. The exercise should increase your heart rate and make you sweat (moderate-intensity exercise). °Do strengthening exercises at least twice a week. This is in addition to the moderate-intensity exercise. °Spend less time sitting. Even light physical activity can be beneficial. °Watch cholesterol and blood lipids °Have your blood tested for lipids and cholesterol at 56 years of age, then have this test every 5 years. °Have your cholesterol levels checked more often if: °Your lipid or cholesterol levels are high. °You are older than 56 years of age. °You are at high risk for heart disease. °What should I know about cancer screening? °Depending on your health history and family history, you may need to have cancer screening at various ages. This may include screening for: °Breast cancer. °Cervical cancer. °Colorectal cancer. °Skin cancer. °Lung cancer. °What should I know about heart disease, diabetes, and high blood pressure? °Blood pressure and heart  disease °High blood pressure causes heart disease and increases the risk of stroke. This is more likely to develop in people who have high blood pressure readings or are overweight. °Have your blood pressure checked: °Every 3-5 years if you are 18-39 years of age. °Every year if you are 40 years old or older. °Diabetes °Have regular diabetes screenings. This checks your fasting blood sugar level. Have the screening done: °Once every three years after age 40 if you are at a normal weight and have a low risk for diabetes. °More often and at a younger age if you are overweight or have a high risk for diabetes. °What should I know about preventing infection? °Hepatitis B °If you have a higher risk for hepatitis B, you should be screened for this virus. Talk with your health care provider to find out if you are at risk for hepatitis B infection. °Hepatitis C °Testing is recommended for: °Everyone born from 1945 through 1965. °Anyone with known risk factors for hepatitis C. °Sexually transmitted infections (STIs) °Get screened for STIs, including gonorrhea and chlamydia, if: °You are sexually active and are younger than 56 years of age. °You are older than 56 years of age and your health care provider tells you that you are at risk for this type of infection. °Your sexual activity has changed since you were last screened, and you are at increased risk for chlamydia or gonorrhea. Ask your health care provider if you are at risk. °Ask your health care provider about whether you are at high risk for HIV. Your health care provider may recommend a prescription medicine to help prevent HIV   infection. If you choose to take medicine to prevent HIV, you should first get tested for HIV. You should then be tested every 3 months for as long as you are taking the medicine. Pregnancy If you are about to stop having your period (premenopausal) and you may become pregnant, seek counseling before you get pregnant. Take 400 to 800  micrograms (mcg) of folic acid every day if you become pregnant. Ask for birth control (contraception) if you want to prevent pregnancy. Osteoporosis and menopause Osteoporosis is a disease in which the bones lose minerals and strength with aging. This can result in bone fractures. If you are 47 years old or older, or if you are at risk for osteoporosis and fractures, ask your health care provider if you should: Be screened for bone loss. Take a calcium or vitamin D supplement to lower your risk of fractures. Be given hormone replacement therapy (HRT) to treat symptoms of menopause. Follow these instructions at home: Alcohol use Do not drink alcohol if: Your health care provider tells you not to drink. You are pregnant, may be pregnant, or are planning to become pregnant. If you drink alcohol: Limit how much you have to: 0-1 drink a day. Know how much alcohol is in your drink. In the [place] one drink equals one 12 oz bottle of beer (355 mL), one 5 oz glass of wine (148 mL), or one 1 oz glass of hard liquor (44 mL). Lifestyle Do not use any products that contain nicotine or tobacco. These products include cigarettes, chewing tobacco, and vaping devices, such as e-cigarettes. If you need help quitting, ask your health care provider. Do not use street drugs. Do not share needles. Ask your health care provider for help if you need support or information about quitting drugs. General instructions Schedule regular health, dental, and eye exams. Stay current with your vaccines. Tell your health care provider if: You often feel depressed. You have ever been abused or do not feel safe at home. Summary Adopting a healthy lifestyle and getting preventive care are important in promoting health and wellness. Follow your health care provider's instructions about healthy diet, exercising, and getting tested or screened for diseases. Follow your health care provider's instructions on monitoring your  cholesterol and blood pressure. This information is not intended to replace advice given to you by your health care provider. Make sure you discuss any questions you have with your health care provider. Document Revised: 11/27/2020 Document Reviewed: 11/27/2020 Elsevier Patient Education  2022 [employer]  Exercising to Lose Weight Getting regular exercise is important for everyone. It is especially important if you are overweight. Being overweight increases your risk of heart disease, stroke, diabetes, high blood pressure, and several types of cancer. Exercising, and reducing the calories you consume, can help you lose weight and improve fitness and health. Exercise can be moderate or vigorous intensity. To lose weight, most people need to do a certain amount of moderate or vigorous-intensity exercise each week. How can exercise affect me? You lose weight when you exercise enough to burn more calories than you eat. Exercise also reduces body fat and builds muscle. The more muscle you have, the more calories you burn. Exercise also: Improves mood. Reduces stress and tension. Improves your overall fitness, flexibility, and endurance. Increases bone strength. Moderate-intensity exercise Moderate-intensity exercise is any activity that gets you moving enough to burn at least three times more energy (calories) than if you were sitting. Examples of moderate exercise include: Walking a mile  in 15 minutes. Doing light yard work. Biking at an easy pace. Most people should get at least 150 minutes of moderate-intensity exercise a week to maintain their body weight. Vigorous-intensity exercise Vigorous-intensity exercise is any activity that gets you moving enough to burn at least six times more calories than if you were sitting. When you exercise at this intensity, you should be working hard enough that you are not able to carry on a conversation. Examples of vigorous exercise  include: Running. Playing a team sport, such as football, basketball, and soccer. Jumping rope. Most people should get at least 75 minutes a week of vigorous exercise to maintain their body weight. What actions can I take to lose weight? The amount of exercise you need to lose weight depends on: Your age. The type of exercise. Any health conditions you have. Your overall physical ability. Talk to your health care provider about how much exercise you need and what types of activities are safe for you. Nutrition  Make changes to your diet as told by your health care provider or diet and nutrition specialist (dietitian). This may include: Eating fewer calories. Eating more protein. Eating less unhealthy fats. Eating a diet that includes fresh fruits and vegetables, whole grains, low-fat dairy products, and lean protein. Avoiding foods with added fat, salt, and sugar. Drink plenty of water while you exercise to prevent dehydration or heat stroke. Activity Choose an activity that you enjoy and set realistic goals. Your health care provider can help you make an exercise plan that works for you. Exercise at a moderate or vigorous intensity most days of the week. The intensity of exercise may vary from person to person. You can tell how intense a workout is for you by paying attention to your breathing and heartbeat. Most people will notice their breathing and heartbeat get faster with more intense exercise. Do resistance training twice each week, such as: Push-ups. Sit-ups. Lifting weights. Using resistance bands. Getting short amounts of exercise can be just as helpful as long, structured periods of exercise. If you have trouble finding time to exercise, try doing these things as part of your daily routine: Get up, stretch, and walk around every 30 minutes throughout the day. Go for a walk during your lunch break. Park your car farther away from your destination. If you take public  transportation, get off one stop early and walk the rest of the way. Make phone calls while standing up and walking around. Take the stairs instead of elevators or escalators. Wear comfortable clothes and shoes with good support. Do not exercise so much that you hurt yourself, feel dizzy, or get very short of breath. Where to find more information [place] Department of Health and Human Services: ThisPath.fi Centers for Disease Control and Prevention: [employer]Exhibition.com.br Contact a health care provider: Before starting a new exercise program. If you have questions or concerns about your weight. If you have a medical problem that keeps you from exercising. Get help right away if: You have any of the following while exercising: Injury. Dizziness. Difficulty breathing or shortness of breath that does not go away when you stop exercising. Chest pain. Rapid heartbeat. These symptoms may represent a serious problem that is an emergency. Do not wait to see if the symptoms will go away. Get medical help right away. Call your local emergency services (911 in the [place] Do not drive yourself to the hospital. Summary Getting regular exercise is especially important if you are overweight. Being overweight increases your  risk of heart disease, stroke, diabetes, high blood pressure, and several types of cancer. Losing weight happens when you burn more calories than you eat. Reducing the amount of calories you eat, and getting regular moderate or vigorous exercise each week, helps you lose weight. This information is not intended to replace advice given to you by your health care provider. Make sure you discuss any questions you have with your health care provider. Document Revised: 09/03/2020 Document Reviewed: 09/03/2020 Elsevier Patient Education  2022 Elsevier Inc.  Goal to exercise 150 minutes per week with at least 2 days of strength training Encouraged to park further when at the store, take stairs instead  of elevators and to walk in place during commercials. Increase water intake to at least one gallon of water daily.

## 2021-08-28 LAB — CMP14+EGFR
ALT: 33 IU/L — ABNORMAL HIGH (ref 0–32)
AST: 33 IU/L (ref 0–40)
Albumin/Globulin Ratio: 1.6 (ref 1.2–2.2)
Albumin: 4.6 g/dL (ref 3.8–4.9)
Alkaline Phosphatase: 104 IU/L (ref 44–121)
BUN/Creatinine Ratio: 30 — ABNORMAL HIGH (ref 9–23)
BUN: 19 mg/dL (ref 6–24)
Bilirubin Total: 0.2 mg/dL (ref 0.0–1.2)
CO2: 26 mmol/L (ref 20–29)
Calcium: 9.9 mg/dL (ref 8.7–10.2)
Chloride: 101 mmol/L (ref 96–106)
Creatinine, Ser: 0.64 mg/dL (ref 0.57–1.00)
Globulin, Total: 2.8 g/dL (ref 1.5–4.5)
Glucose: 89 mg/dL (ref 70–99)
Potassium: 4.1 mmol/L (ref 3.5–5.2)
Sodium: 140 mmol/L (ref 134–144)
Total Protein: 7.4 g/dL (ref 6.0–8.5)
eGFR: 104 mL/min/{1.73_m2} (ref >59)

## 2021-08-28 LAB — VITAMIN D 25 HYDROXY (VIT D DEFICIENCY, FRACTURES): Vit D, 25-Hydroxy: 38.1 ng/mL (ref 30.0–100.0)

## 2021-08-28 LAB — IRON,TIBC AND FERRITIN PANEL
Ferritin: 300 ng/mL — ABNORMAL HIGH (ref 15–150)
Iron Saturation: 18 % (ref 15–55)
Iron: 53 ug/dL (ref 27–159)
Total Iron Binding Capacity: 287 ug/dL (ref 250–450)
UIBC: 234 ug/dL (ref 131–425)

## 2021-08-28 LAB — LIPID PANEL
Chol/HDL Ratio: 3 ratio (ref 0.0–4.4)
Cholesterol, Total: 202 mg/dL — ABNORMAL HIGH (ref 100–199)
HDL: 68 mg/dL (ref >39)
LDL Chol Calc (NIH): 118 mg/dL — ABNORMAL HIGH (ref 0–99)
Triglycerides: 88 mg/dL (ref 0–149)
VLDL Cholesterol Cal: 16 mg/dL (ref 5–40)

## 2021-08-28 LAB — CBC
Hematocrit: 36.5 % (ref 34.0–46.6)
Hemoglobin: 12.3 g/dL (ref 11.1–15.9)
MCH: 28.5 pg (ref 26.6–33.0)
MCHC: 33.7 g/dL (ref 31.5–35.7)
MCV: 85 fL (ref 79–97)
Platelets: 251 10*3/uL (ref 150–450)
RBC: 4.32 x10E6/uL (ref 3.77–5.28)
RDW: 14.2 % (ref 11.7–15.4)
WBC: 10.3 10*3/uL (ref 3.4–10.8)

## 2021-08-28 LAB — HEMOGLOBIN A1C
Est. average glucose Bld gHb Est-mCnc: 108 mg/dL
Hgb A1c MFr Bld: 5.4 % (ref 4.8–5.6)

## 2021-09-11 ENCOUNTER — Ambulatory Visit: Payer: 59

## 2021-09-12 ENCOUNTER — Other Ambulatory Visit: Payer: Self-pay

## 2021-09-12 DIAGNOSIS — M25562 Pain in left knee: Secondary | ICD-10-CM

## 2021-09-12 MED ORDER — MELOXICAM 7.5 MG PO TABS: ORAL_TABLET | ORAL | 2 refills | Status: DC

## 2021-10-12 ENCOUNTER — Ambulatory Visit: Payer: 59

## 2021-10-19 ENCOUNTER — Ambulatory Visit: Payer: 59

## 2021-11-02 ENCOUNTER — Ambulatory Visit
Admission: RE | Admit: 2021-11-02 | Discharge: 2021-11-02 | Disposition: A | Discharge status: Home / Self Care | Payer: 59 | Source: Ambulatory Visit | Attending: Nurse Practitioner | Admitting: Nurse Practitioner

## 2021-11-02 DIAGNOSIS — Z1231 Encounter for screening mammogram for malignant neoplasm of breast: Secondary | ICD-10-CM

## 2022-01-30 ENCOUNTER — Other Ambulatory Visit: Payer: Self-pay

## 2022-01-30 ENCOUNTER — Encounter (HOSPITAL_COMMUNITY): Payer: Self-pay | Admitting: Emergency Medicine

## 2022-01-30 ENCOUNTER — Emergency Department (HOSPITAL_COMMUNITY)
Admission: EM | Admit: 2022-01-30 | Discharge: 2022-01-30 | Disposition: A | Discharge status: Home / Self Care | Payer: 59 | Attending: Emergency Medicine | Admitting: Emergency Medicine

## 2022-01-30 DIAGNOSIS — I83899 Varicose veins of unspecified lower extremities with other complications: Secondary | ICD-10-CM

## 2022-01-30 DIAGNOSIS — I83891 Varicose veins of right lower extremities with other complications: Secondary | ICD-10-CM | POA: Diagnosis present

## 2022-01-30 NOTE — ED Provider Notes (Signed)
[facility]-EMERGENCY DEPT Provider Note   CSN: [id number] Arrival date & time: 01/30/22  2048     History  Chief Complaint  Patient presents with   Leg Injury    Lorraine Evans is a 56 y.o. female.  Patient with no significant past medical history presents to the emergency department today for evaluation of acute onset of bleeding to the right lower extremity.  Patient has a history of varicose veins.  She has had 1 episode of spontaneous bleeding from the area on her right lateral calf.  She states that she needed a stitch in the area when this occurred before.  Tonight she was walking at work and someone told her that she was bleeding.  Patient denies associated traumatic injury.  Pressure was applied and bleeding has stopped.  She has never seen a vascular surgeon.  No lightheadedness or syncope.  No shortness of breath.       Home Medications Prior to Admission medications   Medication Sig Start Date End Date Taking? Authorizing Provider  Cholecalciferol (VITAMIN D PO) Take 1 capsule by mouth daily.    [provider]  Iron, Ferrous Sulfate, 325 (65 Fe) MG TABS Take 1 tablet by mouth daily. 08/18/19   Arnette Felts, FNP  meloxicam (MOBIC) 7.5 MG tablet Take 1 tab by mouth x 5 days then daily as needed 09/12/21   Arnette Felts, FNP  Multiple Vitamin (MULTIVITAMIN WITH MINERALS) TABS tablet Take 1 tablet by mouth daily.    [provider]  Multiple Vitamins-Minerals (ZINC PO) Take by mouth. Take one tablet daily    [provider]  vitamin C (ASCORBIC ACID) 500 MG tablet Take 500 mg by mouth daily.    [provider]      Allergies    Patient has no known allergies.    Review of Systems   Review of Systems  Physical Exam Updated Vital Signs BP (!) 138/93 (BP Location: Left Arm)   Pulse 92   Temp 98.4 F (36.9 C) (Oral)   Resp 17   LMP  (LMP Unknown)   SpO2 99%  Physical Exam Vitals and nursing note reviewed.   Constitutional:      Appearance: She is well-developed.  HENT:     Head: Normocephalic and atraumatic.  Eyes:     Pupils: Pupils are equal, round, and reactive to light.  Cardiovascular:     Pulses: Normal pulses. No decreased pulses.  Musculoskeletal:        General: Tenderness present.     Cervical back: Normal range of motion and neck supple.     Comments: There is a small raised area right lateral calf, no active bleeding.  Likely small erosion associated with varicosity.  Skin:    General: Skin is warm and dry.  Neurological:     Mental Status: She is alert.     Sensory: No sensory deficit.     Comments: Motor, sensation, and vascular distal to the injury is fully intact.   Psychiatric:        Mood and Affect: Mood normal.     ED Results / Procedures / Treatments   Labs (all labs ordered are listed, but only abnormal results are displayed) Labs Reviewed - No data to display  EKG None  Radiology No results found.  Procedures Procedures    Medications Ordered in ED Medications - No data to display  ED Course/ Medical Decision Making/ A&P    Patient seen and  examined. History obtained directly from patient.   Labs/EKG: None ordered, no concern for significant blood loss.  Imaging: None ordered  Medications/Fluids: None ordered  Most recent vital signs reviewed and are as follows: BP (!) 138/93 (BP Location: Left Arm)   Pulse 92   Temp 98.4 F (36.9 C) (Oral)   Resp 17   LMP  (LMP Unknown)   SpO2 99%   Initial impression: Suspect bleeding varicosity.  Bleeding now controlled.  Will apply pressure bandage and likely d/c with instructions to leave on overnight and remove tomorrow.  Vascular surgery follow-up.  10:10 PM Reassessment performed. Patient appears stable.  Plan: Discharge to home.   Prescriptions written for: None  Other home care instructions discussed: Pressure bandage overnight, protecting area over the next several days while the  skin heals.  ED return instructions discussed: Return with recurrent bleeding uncontrolled at home with pressure  Follow-up instructions discussed: Patient encouraged to follow-up with their PCP/vascular referral in 7 days.                           Medical Decision Making  Patient with bleeding from varicose vein in the lower extremity.  No overlying skin findings other than a small ulceration.  No signs of cellulitis or abscess.  Do not suspect arterial bleeding.  Do not suspect significant blood loss.        Final Clinical Impression(s) / ED Diagnoses Final diagnoses:  Ruptured varicose vein    Rx / DC Orders ED Discharge Orders     None         Renne Crigler, Cordelia Poche 01/30/22 2212    Charlynne Pander, MD 01/30/22 [phone]

## 2022-01-30 NOTE — Discharge Instructions (Signed)
Please read and follow all provided instructions.  Your diagnoses today include:  1. Ruptured varicose vein     Tests performed today include: Vital signs. See below for your results today.   Medications prescribed:  None  Home care instructions:  Follow any educational materials contained in this packet.  Leave pressure bandage on until tomorrow morning.  Please protect the area that was bleeding for the next several days.  Follow-up instructions: Please follow-up with your primary care provider as needed for further evaluation of your symptoms.  You may also follow-up with the vascular surgeon listed for evaluation.  Return instructions:  Please return to the Emergency Department if you experience worsening symptoms.  Please return if you have any other emergent concerns.  Additional Information:  Your vital signs today were: BP (!) 138/93 (BP Location: Left Arm)   Pulse 92   Temp 98.4 F (36.9 C) (Oral)   Resp 17   LMP  (LMP Unknown)   SpO2 99%  If your blood pressure (BP) was elevated above 135/85 this visit, please have this repeated by your doctor within one month. ---------------

## 2022-01-30 NOTE — ED Triage Notes (Signed)
While walking today, the patient noticed blood squirting from her right leg. She has had a similar episode of this 2 months ago. The raised area in which the blood is squirting has increased in size over the past 2 months.    EMS vitals: 118/78 BP 90 HR 100% SPO2 on room air 16 RR

## 2022-02-04 ENCOUNTER — Telehealth: Payer: Self-pay

## 2022-02-04 NOTE — Telephone Encounter (Signed)
Pt called stating that she had been admitted to [facility] and the doctor recommended that she see a vein specialist.  Reviewed pt's chart, returned pt's call, two identifiers used. Informed her that she would receive a call once the referral is received and finalized, or if any further information is needed. Confirmed understanding.

## 2022-02-04 NOTE — Telephone Encounter (Signed)
Transition Care Management Unsuccessful Follow-up Telephone Call  Date of discharge and from where:  01/30/2022  Attempts:  1st Attempt  Reason for unsuccessful TCM follow-up call:  Left voice message    

## 2022-02-07 ENCOUNTER — Ambulatory Visit (HOSPITAL_COMMUNITY)
Admission: RE | Admit: 2022-02-07 | Discharge: 2022-02-07 | Disposition: A | Discharge status: Home / Self Care | Payer: 59 | Source: Ambulatory Visit | Attending: Vascular Surgery | Admitting: Vascular Surgery

## 2022-02-07 ENCOUNTER — Other Ambulatory Visit: Payer: Self-pay

## 2022-02-07 DIAGNOSIS — I83899 Varicose veins of unspecified lower extremities with other complications: Secondary | ICD-10-CM

## 2022-02-11 NOTE — Progress Notes (Unsigned)
[facility] [place]  ASSESSMENT / PLAN: Lorraine Evans is a 56 y.o. female with chronic venous insufficiency of right lower extremity causing spontaneous bleeding from varicosity.  Venous duplex is significant for greater saphenous vein reflux from the saphenofemoral junction to the knee. Recommend compression and elevation for symptomatic relief. Follow up with in three months to discuss saphenous vein ablation.  Patient encouraged to call should she have another bleeding episode as this may expedite process.  CHIEF COMPLAINT: Spontaneous bleeding from varicosity  HISTORY OF PRESENT ILLNESS: Lorraine Evans is a 56 y.o. female who presents to clinic for evaluation of chronic venous insufficiency.  The patient reported to [facility] for evaluation of spontaneous bleeding from a prominent varicosity in her right lateral calf.  This stopped with application of pressure.  The patient was referred for further evaluation.  The patient does not report many symptoms in her lower extremities beyond the spontaneous bleeding.  She does have some swelling which is minimally bothersome to her.  Past Medical History:  Diagnosis Date   Anemia    H/O varicella    History of measles     Past Surgical History:  Procedure Laterality Date   CESAREAN SECTION     DILATION AND CURETTAGE OF UTERUS  1987   DILITATION & CURRETTAGE/HYSTROSCOPY WITH HYDROTHERMAL ABLATION N/A 12/03/2017   Procedure: DILATATION & CURETTAGE/HYSTEROSCOPY WITH HYDROTHERMAL ABLATION;  Surgeon: Geryl Rankins, MD;  Location: [facility];  Service: Gynecology;  Laterality: N/A;   gallstones removal  1995    Family History  Problem Relation Age of Onset   Hypertension Mother     Social History   Socioeconomic History   Marital status: Married    Spouse name: Not on file   Number of children: Not on file   Years of education: Not on file   Highest education level: Not on file  Occupational  History   Not on file  Tobacco Use   Smoking status: Never   Smokeless tobacco: Never  Vaping Use   Vaping Use: Never used  Substance and Sexual Activity   Alcohol use: No   Drug use: No   Sexual activity: Not Currently    Birth control/protection: None  Other Topics Concern   Not on file  Social History Narrative   Not on file   Social Determinants of Health   Financial Resource Strain: Not on file  Food Insecurity: Not on file  Transportation Needs: Not on file  Physical Activity: Inactive (08/27/2021)   Exercise Vital Sign    Days of Exercise per Week: 0 days    Minutes of Exercise per Session: 0 min  Stress: Not on file  Social Connections: Not on file  Intimate Partner Violence: Not on file    No Known Allergies  Current Outpatient Medications  Medication Sig Dispense Refill   Cholecalciferol (VITAMIN D PO) Take 1 capsule by mouth daily.     Iron, Ferrous Sulfate, 325 (65 Fe) MG TABS Take 1 tablet by mouth daily. 30 tablet 3   meloxicam (MOBIC) 7.5 MG tablet Take 1 tab by mouth x 5 days then daily as needed 30 tablet 2   Multiple Vitamin (MULTIVITAMIN WITH MINERALS) TABS tablet Take 1 tablet by mouth daily.     Multiple Vitamins-Minerals (ZINC PO) Take by mouth. Take one tablet daily     vitamin C (ASCORBIC ACID) 500 MG tablet Take 500 mg by mouth daily.     No current facility-administered  medications for this visit.    PHYSICAL EXAM Vitals:   02/12/22 1344  BP: 124/85  Pulse: 79  Resp: 20  Temp: 98.8 F (37.1 C)  SpO2: 98%  Weight: 246 lb (111.6 kg)  Height: 5\' 6"  (1.676 m)   Well-appearing middle-aged woman in no acute distress Regular rate and rhythm Unlabored breathing Soft abdomen Palpable pedal pulses bilaterally Multiple prominent varicosities about the lateral calves.    PERTINENT LABORATORY AND RADIOLOGIC DATA  Most recent CBC    Latest Ref Rng & Units 08/27/2021    8:44 AM 08/21/2020    4:03 PM 08/18/2019   12:19 PM  CBC  WBC 3.4 -  10.8 x10E3/uL 10.3  8.4  7.2   Hemoglobin 11.1 - 15.9 g/dL 08/20/2019  16.1  09.6   Hematocrit 34.0 - 46.6 % 36.5  42.6  39.8   Platelets 150 - 450 x10E3/uL 251  304  316      Most recent CMP    Latest Ref Rng & Units 08/27/2021    8:44 AM 08/21/2020    4:03 PM 08/18/2019   12:19 PM  CMP  Glucose 70 - 99 mg/dL 89  88  92   BUN 6 - 24 mg/dL 19  20  15    Creatinine 0.57 - 1.00 mg/dL 08/20/2019   4.09   Sodium 134 - 144 mmol/L 140  142  141   Potassium 3.5 - 5.2 mmol/L 4.1  4.5  4.2   Chloride 96 - 106 mmol/L 101  99  101   CO2 20 - 29 mmol/L 26  21  24    Calcium 8.7 - 10.2 mg/dL 9.9  8.11  9.7   Total Protein 6.0 - 8.5 g/dL 7.4  8.6    Total Bilirubin 0.0 - 1.2 mg/dL 0.2  0.3    Alkaline Phos 44 - 121 IU/L 104  99    AST 0 - 40 IU/L 33  28    ALT 0 - 32 IU/L 33  17      Renal function CrCl cannot be calculated (Patient's most recent lab result is older than the maximum 21 days allowed.).  Hgb A1c MFr Bld (%)  Date Value  08/27/2021 5.4    LDL Chol Calc (NIH)  Date Value Ref Range Status  08/27/2021 118 (H) 0 - 99 mg/dL Final    Right lower extremity venous reflux study:  - No evidence of deep vein thrombosis seen in the right lower extremity,  from the common femoral through the popliteal veins.  - No evidence of superficial venous thrombosis in the right lower  extremity.  - Venous reflux is noted in the right common femoral vein.  - Venous reflux is noted in the right sapheno-femoral junction.  - Venous reflux is noted in the right greater saphenous vein in the thigh.  - Venous reflux is noted in the right greater saphenous vein in the calf.  - Venous reflux is noted in the right short saphenous vein.   78.2. 10/25/2021, MD [facility] Phone Number: [phone] 02/12/2022 4:05 PM  Total time spent on preparing this encounter including chart review, data review, collecting history, examining the patient, coordinating care for this  new patient, 60 minutes.  Portions of this report may have been transcribed using voice recognition software.  Every effort has been made to ensure accuracy; however, inadvertent computerized transcription errors may still be present.

## 2022-02-12 ENCOUNTER — Encounter: Payer: Self-pay | Admitting: Vascular Surgery

## 2022-02-12 ENCOUNTER — Ambulatory Visit (INDEPENDENT_AMBULATORY_CARE_PROVIDER_SITE_OTHER): Payer: 59 | Admitting: Vascular Surgery

## 2022-02-12 VITALS — BP 124/85 | HR 79 | Temp 98.8°F | Resp 20 | Ht 66.0 in | Wt 246.0 lb

## 2022-02-12 DIAGNOSIS — I872 Venous insufficiency (chronic) (peripheral): Secondary | ICD-10-CM | POA: Diagnosis not present

## 2022-02-13 ENCOUNTER — Ambulatory Visit: Payer: 59 | Admitting: Vascular Surgery

## 2022-02-25 ENCOUNTER — Ambulatory Visit (INDEPENDENT_AMBULATORY_CARE_PROVIDER_SITE_OTHER): Payer: 59 | Admitting: Nurse Practitioner

## 2022-02-25 ENCOUNTER — Encounter: Payer: Self-pay | Admitting: Nurse Practitioner

## 2022-02-25 VITALS — BP 120/62 | HR 76 | Temp 97.8°F | Ht 66.0 in | Wt 247.0 lb

## 2022-02-25 DIAGNOSIS — Z23 Encounter for immunization: Secondary | ICD-10-CM | POA: Diagnosis not present

## 2022-02-25 DIAGNOSIS — I8393 Asymptomatic varicose veins of bilateral lower extremities: Secondary | ICD-10-CM | POA: Diagnosis not present

## 2022-02-25 DIAGNOSIS — E782 Mixed hyperlipidemia: Secondary | ICD-10-CM | POA: Diagnosis not present

## 2022-02-25 DIAGNOSIS — Z6839 Body mass index (BMI) 39.0-39.9, adult: Secondary | ICD-10-CM

## 2022-02-25 DIAGNOSIS — M25562 Pain in left knee: Secondary | ICD-10-CM

## 2022-02-25 DIAGNOSIS — E6609 Other obesity due to excess calories: Secondary | ICD-10-CM | POA: Diagnosis not present

## 2022-02-25 LAB — LIPID PANEL
Chol/HDL Ratio: 3.1 ratio (ref 0.0–4.4)
Cholesterol, Total: 192 mg/dL (ref 100–199)
HDL: 62 mg/dL (ref >39)
LDL Chol Calc (NIH): 117 mg/dL — ABNORMAL HIGH (ref 0–99)
Triglycerides: 71 mg/dL (ref 0–149)
VLDL Cholesterol Cal: 13 mg/dL (ref 5–40)

## 2022-02-25 MED ORDER — GABAPENTIN 100 MG PO CAPS: 100.0000 mg | ORAL_CAPSULE | Freq: Every day | ORAL | 2 refills | Status: AC

## 2022-02-25 NOTE — Progress Notes (Signed)
I,[name],acting as a Neurosurgeon for [employer], FNP.,have documented all relevant documentation on the behalf of Arnette Felts, FNP,as directed by  Arnette Felts, FNP while in the presence of Arnette Felts, FNP.  Subjective:     Patient ID: Lorraine Evans , female    DOB: 07/06/1966 , 56 y.o.   MRN: [id number]   Chief Complaint  Patient presents with   Weight Check    HPI  Patient presents today for a weight check.  She has not been active with her diet.  She is not exercising regularly. She is now wearing TED/Support socks due to having superficial veins to bleed. She is drinking 3 bottles of water a day. 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   Wt Readings from Last 3 Encounters: 02/25/22 : 247 lb (112 kg) 02/12/22 : 246 lb (111.6 kg) 08/27/21 : 259 lb 6.4 oz (117.7 kg)      Past Medical History:  Diagnosis Date   Anemia    H/O varicella    History of measles      Family History  Problem Relation Age of Onset   Hypertension Mother      Current Outpatient Medications:    Cholecalciferol (VITAMIN D PO), Take 1 capsule by mouth daily., Disp: , Rfl:    Iron, Ferrous Sulfate, 325 (65 Fe) MG TABS, Take 1 tablet by mouth daily., Disp: 30 tablet, Rfl: 3   meloxicam (MOBIC) 7.5 MG tablet, Take 1 tab by mouth x 5 days then daily as needed, Disp: 30 tablet, Rfl: 2   Multiple Vitamin (MULTIVITAMIN WITH MINERALS) TABS tablet, Take 1 tablet by mouth daily., Disp: , Rfl:    Multiple Vitamins-Minerals (ZINC PO), Take by mouth. Take one tablet daily, Disp: , Rfl:    vitamin C (ASCORBIC ACID) 500 MG tablet, Take 500 mg by mouth daily., Disp: , Rfl:    No Known Allergies   Review of Systems  Constitutional: Negative.   Respiratory: Negative.    Cardiovascular: Negative.   Gastrointestinal: Negative.   Neurological: Negative.   Psychiatric/Behavioral: Negative.       Today's Vitals   02/25/22 0828  BP: 120/62   Pulse: 76  Temp: 97.8 F (36.6 C)  TempSrc: Oral  Weight: 247 lb (112 kg)  Height: 5\' 6"  (1.676 m)   Body mass index is 39.87 kg/m.  Wt Readings from Last 3 Encounters:  02/25/22 247 lb (112 kg)  02/12/22 246 lb (111.6 kg)  08/27/21 259 lb 6.4 oz (117.7 kg)    Objective:  Physical Exam Vitals reviewed.  Constitutional:      General: She is not in acute distress.    Appearance: Normal appearance. She is obese.  Cardiovascular:     Rate and Rhythm: Normal rate.     Pulses: Normal pulses.     Heart sounds: Normal heart sounds. No murmur heard. Pulmonary:     Effort: Pulmonary effort is normal. No respiratory distress.     Breath sounds: Normal breath sounds. No wheezing.  Skin:    Capillary Refill: Capillary refill takes less than 2 seconds.  Neurological:     General: No focal deficit present.     Mental Status: She is alert and oriented to person, place, and time.     Cranial Nerves: No cranial nerve deficit.     Motor: No weakness.  Psychiatric:        Mood and Affect:  Mood normal.        Behavior: Behavior normal.        Thought Content: Thought content normal.        Judgment: Judgment normal.         Assessment And Plan:     1. Mixed hyperlipidemia Comments: Improved at last visit, continue to focus on low fat diet.  - Lipid panel  2. Acute pain of left knee Comments: Continue to focus on losing weight and will increase meloxicam or change to another med   3. Asymptomatic varicose veins of both lower extremities Comments: She is now wearing support socks to work and following up with [facility]  4. Class 2 obesity due to excess calories without serious comorbidity with body mass index (BMI) of 39.0 to 39.9 in adult Comments: Discussed Saxenda, at this time will consider has a fear of needles. Given video to review. Referral to Healthy Weight to assist with diet. Has taken phentermine in the past with some weight loss however not taken in quite some  time. Discussed being consistent with medications and journey to weight loss. She is encouraged to strive for BMI less than 30 to decrease cardiac risk. Advised to aim for at least 150 minutes of exercise per week - Amb Ref to Medical Weight Management  5. Encounter for immunization Shingles #1 administered in office, will do 2nd injection in 2-6 months - Zoster Recombinant (Shingrix )      Patient was given opportunity to ask questions. Patient verbalized understanding of the plan and was able to repeat key elements of the plan. All questions were answered to their satisfaction.  Arnette Felts, FNP   I, Arnette Felts, FNP, have reviewed all documentation for this visit. The documentation on 02/25/22 for the exam, diagnosis, procedures, and orders are all accurate and complete.   IF YOU HAVE BEEN REFERRED TO A SPECIALIST, IT MAY TAKE 1-2 WEEKS TO SCHEDULE/PROCESS THE REFERRAL. IF YOU HAVE NOT HEARD FROM US/SPECIALIST IN TWO WEEKS, PLEASE GIVE [place] A CALL AT [phone].   THE PATIENT IS ENCOURAGED TO PRACTICE SOCIAL DISTANCING DUE TO THE COVID-19 PANDEMIC.

## 2022-02-25 NOTE — Patient Instructions (Addendum)
Obesity, Adult ?Obesity is having too much body fat. Being obese means that your weight is more than what is healthy for you.  ?BMI (body mass index) is a number that explains how much body fat you have. If you have a BMI of 30 or more, you are obese. ?Obesity can cause serious health problems, such as: ?Stroke. ?Coronary artery disease (CAD). ?Type 2 diabetes. ?Some types of cancer. ?High blood pressure (hypertension). ?High cholesterol. ?Gallbladder stones. ?Obesity can also contribute to: ?Osteoarthritis. ?Sleep apnea. ?Infertility problems. ?What are the causes? ?Eating meals each day that are high in calories, sugar, and fat. ?Drinking a lot of drinks that have sugar in them. ?Being born with genes that may make you more likely to become obese. ?Having a medical condition that causes obesity. ?Taking certain medicines. ?Sitting a lot (having a sedentary lifestyle). ?Not getting enough sleep. ?What increases the risk? ?Having a family history of obesity. ?Living in an area with limited access to: ?Parks, recreation centers, or sidewalks. ?Healthy food choices, such as grocery stores and farmers' markets. ?What are the signs or symptoms? ?The main sign is having too much body fat. ?How is this treated? ?Treatment for this condition often includes changing your lifestyle. Treatment may include: ?Changing your diet. This may include making a healthy meal plan. ?Exercise. This may include activity that causes your heart to beat faster (aerobic exercise) and strength training. Work with your doctor to design a program that works for you. ?Medicine to help you lose weight. This may be used if you are not able to lose one pound a week after 6 weeks of healthy eating and more exercise. ?Treating conditions that cause the obesity. ?Surgery. Options may include gastric banding and gastric bypass. This may be done if: ?Other treatments have not helped to improve your condition. ?You have a BMI of 40 or higher. ?You have  life-threatening health problems related to obesity. ?Follow these instructions at home: ?Eating and drinking ? ?Follow advice from your doctor about what to eat and drink. Your doctor may tell you to: ?Limit fast food, sweets, and processed snack foods. ?Choose low-fat options. For example, choose low-fat milk instead of whole milk. ?Eat five or more servings of fruits or vegetables each day. ?Eat at home more often. This gives you more control over what you eat. ?Choose healthy foods when you eat out. ?Learn to read food labels. This will help you learn how much food is in one serving. ?Keep low-fat snacks available. ?Avoid drinks that have a lot of sugar in them. These include soda, fruit juice, iced tea with sugar, and flavored milk. ?Drink enough water to keep your pee (urine) pale yellow. ?Do not go on fad diets. ?Physical activity ?Exercise often, as told by your doctor. Most adults should get up to 150 minutes of moderate-intensity exercise every week.Ask your doctor: ?What types of exercise are safe for you. ?How often you should exercise. ?Warm up and stretch before being active. ?Do slow stretching after being active (cool down). ?Rest between times of being active. ?Lifestyle ?Work with your doctor and a food expert (dietitian) to set a weight-loss goal that is best for you. ?Limit your screen time. ?Find ways to reward yourself that do not involve food. ?Do not drink alcohol if: ?Your doctor tells you not to drink. ?You are pregnant, may be pregnant, or are planning to become pregnant. ?If you drink alcohol: ?Limit how much you have to: ?0-1 drink a day for women. ?0-2 drinks   a day for men. Know how much alcohol is in your drink. In the [place] one drink equals one 12 oz bottle of beer (355 mL), one 5 oz glass of wine (148 mL), or one 1 oz glass of hard liquor (44 mL). General instructions Keep a weight-loss journal. This can help you keep track of: The food that you eat. How much exercise you  get. Take over-the-counter and prescription medicines only as told by your doctor. Take vitamins and supplements only as told by your doctor. Think about joining a support group. Pay attention to your mental health as obesity can lead to depression or self esteem issues. Keep all follow-up visits. Contact a doctor if: You cannot meet your weight-loss goal after you have changed your diet and lifestyle for 6 weeks. You are having trouble breathing. Summary Obesity is having too much body fat. Being obese means that your weight is more than what is healthy for you. Work with your doctor to set a weight-loss goal. Get regular exercise as told by your doctor. This information is not intended to replace advice given to you by your health care provider. Make sure you discuss any questions you have with your health care provider. Document Revised: 02/13/2021 Document Reviewed: 02/13/2021 Elsevier Patient Education  [place].  Liraglutide Injection (Weight Management) - [employer] What is this medication? LIRAGLUTIDE (LIR a GLOO tide) promotes weight loss. It may also be used to maintain weight loss. It works by decreasing appetite. Changes to diet and exercise are often combined with this medication. This medicine may be used for other purposes; ask your health care provider or pharmacist if you have questions. COMMON BRAND NAME(S): Saxenda What should I tell my care team before I take this medication? They need to know if you have any of these conditions: Endocrine tumors (MEN 2) or if someone in your family had these tumors Gallbladder disease High cholesterol History of alcohol abuse problem History of pancreatitis Kidney disease or if you are on dialysis Liver disease Previous swelling of the tongue, face, or lips with difficulty breathing, difficulty swallowing, hoarseness, or tightening of the throat Stomach problems Suicidal thoughts, plans, or attempt; a previous suicide  attempt by you or a family member Thyroid cancer or if someone in your family had thyroid cancer An unusual or allergic reaction to liraglutide, other medications, foods, dyes, or preservatives Pregnant or trying to get pregnant Breast-feeding How should I use this medication? This medication is for injection under the skin of your upper leg, stomach area, or upper arm. You will be taught how to prepare and give this medication. Use exactly as directed. Take your medication at regular intervals. Do not take it more often than directed. This medication comes with INSTRUCTIONS FOR USE. Ask your pharmacist for directions on how to use this medication. Read the information carefully. Talk to your pharmacist or care team if you have questions. It is important that you put your used needles and syringes in a special sharps container. Do not put them in a trash can. If you do not have a sharps container, call your pharmacist or care team to get one. A special MedGuide will be given to you by the pharmacist with each prescription and refill. Be sure to read this information carefully each time. Talk to your care team about the use of this medication in children. While it may be prescribed for children as young as 73 years of age for selected conditions, precautions do apply. Overdosage:  If you think you have taken too much of this medicine contact a poison control center or emergency room at once. NOTE: This medicine is only for you. Do not share this medicine with others. What if I miss a dose? If you miss a dose, take it as soon as you can. If it is almost time for your next dose, take only that dose. Do not take double or extra doses. If you miss your dose for 3 days or more, call your care team to talk about how to restart this medicine. What may interact with this medication? Insulin and other medications for diabetes This list may not describe all possible interactions. Give your health care provider  a list of all the medicines, herbs, non-prescription drugs, or dietary supplements you use. Also tell them if you smoke, drink alcohol, or use illegal drugs. Some items may interact with your medicine. What should I watch for while using this medication? Visit your care team for regular checks on your progress. Drink plenty of fluids while taking this medication. Check with your care team if you get an attack of severe diarrhea, nausea, and vomiting. The loss of too much body fluid can make it dangerous for you to take this medication. This medication may affect blood sugar levels. Ask your care team if changes in diet or medications are needed if you have diabetes. Patients and their families should watch out for worsening depression or thoughts of suicide. Also watch out for sudden changes in feelings such as feeling anxious, agitated, panicky, irritable, hostile, aggressive, impulsive, severely restless, overly excited and hyperactive, or not being able to sleep. If this happens, especially at the beginning of treatment or after a change in dose, call your care team. Women should inform their care team if they wish to become pregnant or think they might be pregnant. Losing weight while pregnant is not advised and may cause harm to the unborn child. Talk to your care team for more information. What side effects may I notice from receiving this medication? Side effects that you should report to your care team as soon as possible: Allergic reactions or angioedema--skin rash, itching, hives, swelling of the face, eyes, lips, tongue, arms, or legs, trouble swallowing or breathing Fast or irregular heartbeat Gallbladder problems--severe stomach pain, nausea, vomiting, fever Kidney injury--decrease in the amount of urine, swelling of the ankles, hands, or feet Pancreatitis--severe stomach pain that spreads to your back or gets worse after eating or when touched, fever, nausea, vomiting Thoughts of suicide  or self-harm, worsening mood, feelings of depression Thyroid cancer--new mass or lump in the neck, pain or trouble swallowing, trouble breathing, hoarseness Side effects that usually do not require medical attention (report to your care team if they continue or are bothersome): Constipation Dizziness Fatigue Headache Loss of Appetite Nausea Upset stomach This list may not describe all possible side effects. Call your doctor for medical advice about side effects. You may report side effects to FDA at 1-800-FDA-1088. Where should I keep my medication? Keep out of the reach of children and pets. Store unopened pen in a refrigerator between 2 and 8 degrees C (36 and 46 degrees F). Do not freeze or use if the medication has been frozen. Protect from light and excessive heat. After you first use the pen, it can be stored at room temperature between 15 and 30 degrees C (59 and 86 degrees F) or in a refrigerator. Throw away your used pen after 30 days or after  the expiration date, whichever comes first. Do not store your pen with the needle attached. If the needle is left on, medication may leak from the pen. NOTE: This sheet is a summary. It may not cover all possible information. If you have questions about this medicine, talk to your doctor, pharmacist, or health care provider.  2023 Elsevier/Gold Standard (2020-08-11 00:00:00)  Saxenda Video  To view the content, go to this web address: [email or web address]  This video will expire on: 01/12/2024. If you need access to this video following this date, please reach out to the healthcare provider who assigned it to you. This information is not intended to replace advice given to you by your health care provider. Make sure you discuss any questions you have with your health care provider. Elsevier Patient Education  [place]: Nutrition Eating a balanced diet is an important part of a healthy lifestyle. In  this video, you will learn the benefits of good nutrition. To view the content, go to this web address: [email or web address]  This video will expire on: 01/12/2024. If you need access to this video following this date, please reach out to the healthcare provider who assigned it to you. This information is not intended to replace advice given to you by your health care provider. Make sure you discuss any questions you have with your health care provider. Elsevier Patient Education  [place] Following a healthy eating pattern may help you to achieve and maintain a healthy body weight, reduce the risk of chronic disease, and live a long and productive life. It is important to follow a healthy eating pattern at an appropriate calorie level for your body. Your nutritional needs should be met primarily through food by choosing a variety of nutrient-rich foods. What are tips for following this plan? Reading food labels Read labels and choose the following: Reduced or low sodium. Juices with 100% fruit juice. Foods with low saturated fats and high polyunsaturated and monounsaturated fats. Foods with whole grains, such as whole wheat, cracked wheat, brown rice, and wild rice. Whole grains that are fortified with folic acid. This is recommended for women who are pregnant or who want to become pregnant. Read labels and avoid the following: Foods with a lot of added sugars. These include foods that contain brown sugar, corn sweetener, corn syrup, dextrose, fructose, glucose, high-fructose corn syrup, honey, invert sugar, lactose, malt syrup, maltose, molasses, raw sugar, sucrose, trehalose, or turbinado sugar. Do not eat more than the following amounts of added sugar per day: 6 teaspoons (25 g) for women. 9 teaspoons (38 g) for men. Foods that contain processed or refined starches and grains. Refined grain products, such as white flour, degermed cornmeal, white  bread, and white rice. Shopping Choose nutrient-rich snacks, such as vegetables, whole fruits, and nuts. Avoid high-calorie and high-sugar snacks, such as potato chips, fruit snacks, and candy. Use oil-based dressings and spreads on foods instead of solid fats such as butter, stick margarine, or cream cheese. Limit pre-made sauces, mixes, and "instant" products such as flavored rice, instant noodles, and ready-made pasta. Try more plant-protein sources, such as tofu, tempeh, black beans, edamame, lentils, nuts, and seeds. Explore eating plans such as the Mediterranean diet or vegetarian diet. Cooking Use oil to saut or stir-fry foods instead of solid fats such as butter, stick margarine, or lard. Try baking, boiling, grilling, or broiling instead of frying. Remove the fatty part of meats before cooking. Steam  vegetables in water or broth. Meal planning  At meals, imagine dividing your plate into fourths: One-half of your plate is fruits and vegetables. One-fourth of your plate is whole grains. One-fourth of your plate is protein, especially lean meats, poultry, eggs, tofu, beans, or nuts. Include low-fat dairy as part of your daily diet. Lifestyle Choose healthy options in all settings, including home, work, school, restaurants, or stores. Prepare your food safely: Wash your hands after handling raw meats. Keep food preparation surfaces clean by regularly washing with hot, soapy water. Keep raw meats separate from ready-to-eat foods, such as fruits and vegetables. Cook seafood, meat, poultry, and eggs to the recommended internal temperature. Store foods at safe temperatures. In general: Keep cold foods at 15F (4.4C) or below. Keep hot foods at 115F (60C) or above. Keep your freezer at [facility] (-17.8C) or below. Foods are no longer safe to eat when they have been between the temperatures of 40-115F (4.4-60C) for more than 2 hours. What foods should I eat? Fruits Aim to eat 2  cup-equivalents of fresh, canned (in natural juice), or frozen fruits each day. Examples of 1 cup-equivalent of fruit include 1 small apple, 8 large strawberries, 1 cup canned fruit,  cup dried fruit, or 1 cup 100% juice. Vegetables Aim to eat 2-3 cup-equivalents of fresh and frozen vegetables each day, including different varieties and colors. Examples of 1 cup-equivalent of vegetables include 2 medium carrots, 2 cups raw, leafy greens, 1 cup chopped vegetable (raw or cooked), or 1 medium baked potato. Grains Aim to eat 6 ounce-equivalents of whole grains each day. Examples of 1 ounce-equivalent of grains include 1 slice of bread, 1 cup ready-to-eat cereal, 3 cups popcorn, or  cup cooked rice, pasta, or cereal. Meats and other proteins Aim to eat 5-6 ounce-equivalents of protein each day. Examples of 1 ounce-equivalent of protein include 1 egg, 1/2 cup nuts or seeds, or 1 tablespoon (16 g) peanut butter. A cut of meat or fish that is the size of a deck of cards is about 3-4 ounce-equivalents. Of the protein you eat each week, try to have at least 8 ounces come from seafood. This includes salmon, trout, herring, and anchovies. Dairy Aim to eat 3 cup-equivalents of fat-free or low-fat dairy each day. Examples of 1 cup-equivalent of dairy include 1 cup (240 mL) milk, 8 ounces (250 g) yogurt, 1 ounces (44 g) natural cheese, or 1 cup (240 mL) fortified soy milk. Fats and oils Aim for about 5 teaspoons (21 g) per day. Choose monounsaturated fats, such as canola and olive oils, avocados, peanut butter, and most nuts, or polyunsaturated fats, such as sunflower, corn, and soybean oils, walnuts, pine nuts, sesame seeds, sunflower seeds, and flaxseed. Beverages Aim for six 8-oz glasses of water per day. Limit coffee to three to five 8-oz cups per day. Limit caffeinated beverages that have added calories, such as soda and energy drinks. Limit alcohol intake to no more than 1 drink a day for nonpregnant women  and 2 drinks a day for men. One drink equals 12 oz of beer (355 mL), 5 oz of wine (148 mL), or 1 oz of hard liquor (44 mL). Seasoning and other foods Avoid adding excess amounts of salt to your foods. Try flavoring foods with herbs and spices instead of salt. Avoid adding sugar to foods. Try using oil-based dressings, sauces, and spreads instead of solid fats. This information is based on general [place] nutrition guidelines. For more information, visit [employer].es. Exact amounts may  vary based on your nutrition needs. Summary A healthy eating plan may help you to maintain a healthy weight, reduce the risk of chronic diseases, and stay active throughout your life. Plan your meals. Make sure you eat the right portions of a variety of nutrient-rich foods. Try baking, boiling, grilling, or broiling instead of frying. Choose healthy options in all settings, including home, work, school, restaurants, or stores. This information is not intended to replace advice given to you by your health care provider. Make sure you discuss any questions you have with your health care provider. Document Revised: 03/06/2021 Document Reviewed: 03/06/2021 Elsevier Patient Education  [place].

## 2022-05-29 ENCOUNTER — Ambulatory Visit: Payer: Self-pay | Admitting: Vascular Surgery

## 2022-08-28 ENCOUNTER — Ambulatory Visit: Payer: Self-pay | Admitting: Vascular Surgery

## 2022-09-04 ENCOUNTER — Encounter: Payer: 59 | Admitting: Nurse Practitioner

## 2022-11-14 ENCOUNTER — Encounter: Payer: Self-pay | Admitting: Nurse Practitioner

## 2022-11-14 ENCOUNTER — Other Ambulatory Visit: Payer: Self-pay | Admitting: Nurse Practitioner

## 2022-11-14 ENCOUNTER — Ambulatory Visit (INDEPENDENT_AMBULATORY_CARE_PROVIDER_SITE_OTHER): Payer: No Typology Code available for payment source | Admitting: Nurse Practitioner

## 2022-11-14 VITALS — BP 122/78 | HR 87 | Temp 98.5°F | Ht 66.0 in | Wt 274.0 lb

## 2022-11-14 DIAGNOSIS — Z6841 Body Mass Index (BMI) 40.0 and over, adult: Secondary | ICD-10-CM | POA: Diagnosis not present

## 2022-11-14 DIAGNOSIS — E782 Mixed hyperlipidemia: Secondary | ICD-10-CM | POA: Diagnosis not present

## 2022-11-14 DIAGNOSIS — R739 Hyperglycemia, unspecified: Secondary | ICD-10-CM | POA: Insufficient documentation

## 2022-11-14 DIAGNOSIS — Z1231 Encounter for screening mammogram for malignant neoplasm of breast: Secondary | ICD-10-CM

## 2022-11-14 DIAGNOSIS — M25562 Pain in left knee: Secondary | ICD-10-CM | POA: Diagnosis not present

## 2022-11-14 DIAGNOSIS — Z23 Encounter for immunization: Secondary | ICD-10-CM

## 2022-11-14 DIAGNOSIS — Z860100 Personal history of colon polyps, unspecified: Secondary | ICD-10-CM | POA: Insufficient documentation

## 2022-11-14 MED ORDER — MELOXICAM 7.5 MG PO TABS
ORAL_TABLET | ORAL | 2 refills | Status: DC
Start: 2022-11-14 — End: 2023-10-17

## 2022-11-14 MED ORDER — WEGOVY 0.25 MG/0.5ML ~~LOC~~ SOAJ: 0.2500 mg | SUBCUTANEOUS | 0 refills | Status: DC

## 2022-11-14 NOTE — Patient Instructions (Signed)
Goal to exercise 150 minutes per week with at least 2 days of strength training Encouraged to park further when at the store, take stairs instead of elevators and to walk in place during commercials. Increase water intake to at least one gallon of water daily. You can use Myfitnesspal free app Work on weight loss. Goal daily exercise at least 45 minutes per day, eat off the small plate to help limit portions, try to eat more vegetables and fruits and fewer fried foods and simple carbs like white bread, and white potatoes. Try to limit sugar intake to 26 grams per day- its a challenge! Avoid sugar sweetened beverages like sweet tea and soda. Try to limit alcohol intake since its a lot of calories. Try to lose a pound a week. You can do this! Try reading "Mindless Eating" by Dr. Lynnell Grain. We all overeat for a variety of reasons and he targets those reasons and give practical advice on how to get around ourselves. Also read "Always Hungry" by Dr. Jac Canavan who is a diabetes specialist who explains why sugar consumption makes weight loss so hard and gives recipes to get around this.   For any break at work, try the 7 minute work out. If you have 15 minutes, do it twice! Here is a link to it: http://well.blogs.nytimes.com/2011/11/28/the-scientific-7-minute-workout. There is also an app you can get for your phone.   https://www.carpenter-henry.[email or web address]

## 2022-11-14 NOTE — Progress Notes (Signed)
I,Sheena H Holbrook,acting as a Neurosurgeon for Arnette Felts, FNP.,have documented all relevant documentation on the behalf of Arnette Felts, FNP,as directed by  Arnette Felts, FNP while in the presence of Arnette Felts, FNP.    Subjective:     Patient ID: Lorraine Evans , female    DOB: 08-08-1965 , 57 y.o.   MRN: [id number]   Chief Complaint  Patient presents with   Medical Management of Chronic Issues    HPI  Patient presents today for follow up. Patient has no other complaints or concerns.   Wt Readings from Last 3 Encounters: 11/14/22 : 274 lb (124.3 kg) 02/25/22 : 247 lb (112 kg) 02/12/22 : 246 lb (111.6 kg)  She is walking 3 days a week 30 minutes per day She admits her eating is not good when she comes home she will eat and lay down. She feels like she is not comfortable. She will eat a full meal.      Past Medical History:  Diagnosis Date   Anemia    H/O varicella    History of measles      Family History  Problem Relation Age of Onset   Hypertension Mother      Current Outpatient Medications:    Cholecalciferol (VITAMIN D PO), Take 1 capsule by mouth daily., Disp: , Rfl:    Iron, Ferrous Sulfate, 325 (65 Fe) MG TABS, Take 1 tablet by mouth daily., Disp: 30 tablet, Rfl: 3   latanoprost (XALATAN) 0.005 % ophthalmic solution, SMARTSIG:In Eye(s), Disp: , Rfl:    Multiple Vitamin (MULTIVITAMIN WITH MINERALS) TABS tablet, Take 1 tablet by mouth daily., Disp: , Rfl:    Semaglutide-Weight Management (WEGOVY) 0.25 MG/0.5ML SOAJ, Inject 0.25 mg into the skin once a week., Disp: 2 mL, Rfl: 0   vitamin C (ASCORBIC ACID) 500 MG tablet, Take 500 mg by mouth daily., Disp: , Rfl:    gabapentin (NEURONTIN) 100 MG capsule, Take 1 capsule (100 mg total) by mouth daily. May titrate up to 300 mg as tolerated (Patient not taking: Reported on 11/14/2022), Disp: 30 capsule, Rfl: 2   meloxicam (MOBIC) 7.5 MG tablet, Take 1 tab by mouth daily, Disp: 30 tablet, Rfl: 2   No Known Allergies    Review of Systems  Constitutional: Negative.   Respiratory: Negative.    Cardiovascular:  Positive for leg swelling.  Psychiatric/Behavioral: Negative.    All other systems reviewed and are negative.    Today's Vitals   11/14/22 0911  BP: 122/78  Pulse: 87  Temp: 98.5 F (36.9 C)  TempSrc: Oral  SpO2: 98%  Weight: 274 lb (124.3 kg)  Height:  (1.676 m)   Body mass index is 44.22 kg/m.   Objective:  Physical Exam Vitals reviewed.  Constitutional:      General: She is not in acute distress.    Appearance: Normal appearance. She is obese.  Cardiovascular:     Rate and Rhythm: Normal rate.     Pulses: Normal pulses.     Heart sounds: Normal heart sounds. No murmur heard. Pulmonary:     Effort: Pulmonary effort is normal. No respiratory distress.     Breath sounds: Normal breath sounds. No wheezing.  Skin:    Capillary Refill: Capillary refill takes less than 2 seconds.  Neurological:     General: No focal deficit present.     Mental Status: She is alert and oriented to person, place, and time.     Cranial Nerves: No cranial nerve  deficit.     Motor: No weakness.  Psychiatric:        Mood and Affect: Mood normal.        Behavior: Behavior normal.        Thought Content: Thought content normal.        Judgment: Judgment normal.         Assessment And Plan:     1. Mixed hyperlipidemia Comments: Continue focusing on healthy diet and exercising at least 150 minutes per week.  2. Acute pain of left knee Comments: encouraged to lose weight and will treat with meloxicam for 5 days then daily as needed.  - meloxicam (MOBIC) 7.5 MG tablet; Take 1 tab by mouth daily  Dispense: 30 tablet; Refill: 2  3. Class 3 severe obesity due to excess calories with serious comorbidity and body mass index (BMI) of 40.0 to 44.9 in adult [facility]) She is encouraged to strive for BMI less than 30 to decrease cardiac risk.  Advised to aim for at least 150 minutes of exercise per  week. Discussed healthy diet and regular exercise options  Encouraged to exercise at least 150 minutes per week with 2 days of strength training Will start [facility] pending insurance approval she is to titrate weekly, discussed side effects of nausea, abdominal pain or difficulty swallowing to notify office. [facility] teaching to be done once she picks up the medication.  Return in 2 months for weight check. - Semaglutide-Weight Management (WEGOVY) 0.25 MG/0.5ML SOAJ; Inject 0.25 mg into the skin once a week.  Dispense: 2 mL; Refill: 0  4. Need for zoster vaccination 1st dose of shingrix given in office.    Patient was given opportunity to ask questions. Patient verbalized understanding of the plan and was able to repeat key elements of the plan. All questions were answered to their satisfaction.  Arnette Felts, FNP   I, Arnette Felts, FNP, have reviewed all documentation for this visit. The documentation on 11/14/22 for the exam, diagnosis, procedures, and orders are all accurate and complete.   IF YOU HAVE BEEN REFERRED TO A SPECIALIST, IT MAY TAKE 1-2 WEEKS TO SCHEDULE/PROCESS THE REFERRAL. IF YOU HAVE NOT HEARD FROM US/SPECIALIST IN TWO WEEKS, PLEASE GIVE [place] A CALL AT [phone].   THE PATIENT IS ENCOURAGED TO PRACTICE SOCIAL DISTANCING DUE TO THE COVID-19 PANDEMIC.

## 2022-11-15 ENCOUNTER — Telehealth: Payer: Self-pay

## 2022-11-15 NOTE — Telephone Encounter (Signed)
PA for [facility] was sent to plan

## 2022-11-19 ENCOUNTER — Ambulatory Visit: Payer: No Typology Code available for payment source

## 2022-11-22 ENCOUNTER — Ambulatory Visit (INDEPENDENT_AMBULATORY_CARE_PROVIDER_SITE_OTHER): Payer: No Typology Code available for payment source

## 2022-11-22 VITALS — BP 120/80 | HR 98 | Temp 98.5°F | Ht 66.0 in | Wt 274.0 lb

## 2022-11-22 DIAGNOSIS — Z23 Encounter for immunization: Secondary | ICD-10-CM

## 2022-11-22 NOTE — Progress Notes (Signed)
Patient presents today for 2nd shingles vaccine. 2nd shingles vaccine was giving.

## 2022-11-22 NOTE — Patient Instructions (Signed)
Recombinant Zoster (Shingles) Vaccine: What You Need to Know 1. Why get vaccinated? Recombinant zoster (shingles) vaccine can prevent shingles. Shingles (also called herpes zoster, or just zoster) is a painful skin rash, usually with blisters. In addition to the rash, shingles can cause fever, headache, chills, or upset stomach. Rarely, shingles can lead to complications such as pneumonia, hearing problems, blindness, brain inflammation (encephalitis), or death. The risk of shingles increases with age. The most common complication of shingles is long-term nerve pain called postherpetic neuralgia (PHN). PHN occurs in the areas where the shingles rash was and can last for months or years after the rash goes away. The pain from PHN can be severe and debilitating. The risk of PHN increases with age. An older adult with shingles is more likely to develop PHN and have longer lasting and more severe pain than a younger person. People with weakened immune systems also have a higher risk of getting shingles and complications from the disease. Shingles is caused by varicella-zoster virus, the same virus that causes chickenpox. After you have chickenpox, the virus stays in your body and can cause shingles later in life. Shingles cannot be passed from one person to another, but the virus that causes shingles can spread and cause chickenpox in someone who has never had chickenpox or has never received chickenpox vaccine. 2. Recombinant shingles vaccine Recombinant shingles vaccine provides strong protection against shingles. By preventing shingles, recombinant shingles vaccine also protects against PHN and other complications. Recombinant shingles vaccine is recommended for: Adults 50 years and older Adults 19 years and older who have a weakened immune system because of disease or treatments Shingles vaccine is given as a two-dose series. For most people, the second dose should be given 2 to 6 months after the first  dose. Some people who have or will have a weakened immune system can get the second dose 1 to 2 months after the first dose. Ask your health care provider for guidance. People who have had shingles in the past and people who have received varicella (chickenpox) vaccine are recommended to get recombinant shingles vaccine. The vaccine is also recommended for people who have already gotten another type of shingles vaccine, the live shingles vaccine. There is no live virus in recombinant shingles vaccine. Shingles vaccine may be given at the same time as other vaccines. 3. Talk with your health care provider Tell your vaccination provider if the person getting the vaccine: Has had an allergic reaction after a previous dose of recombinant shingles vaccine, or has any severe, life-threatening allergies Is currently experiencing an episode of shingles Is pregnant In some cases, your health care provider may decide to postpone shingles vaccination until a future visit. People with minor illnesses, such as a cold, may be vaccinated. People who are moderately or severely ill should usually wait until they recover before getting recombinant shingles vaccine. Your health care provider can give you more information. 4. Risks of a vaccine reaction A sore arm with mild or moderate pain is very common after recombinant shingles vaccine. Redness and swelling can also happen at the site of the injection. Tiredness, muscle pain, headache, shivering, fever, stomach pain, and nausea are common after recombinant shingles vaccine. These side effects may temporarily prevent a vaccinated person from doing regular activities. Symptoms usually go away on their own in 2 to 3 days. You should still get the second dose of recombinant shingles vaccine even if you had one of these reactions after the first dose. Guillain-Barr   syndrome (GBS), a serious nervous system disorder, has been reported very rarely after recombinant zoster  vaccine. People sometimes faint after medical procedures, including vaccination. Tell your provider if you feel dizzy or have vision changes or ringing in the ears. As with any medicine, there is a very remote chance of a vaccine causing a severe allergic reaction, other serious injury, or death. 5. What if there is a serious problem? An allergic reaction could occur after the vaccinated person leaves the clinic. If you see signs of a severe allergic reaction (hives, swelling of the face and throat, difficulty breathing, a fast heartbeat, dizziness, or weakness), call 9-1-1 and get the person to the nearest hospital. For other signs that concern you, call your health care provider. Adverse reactions should be reported to the Vaccine Adverse Event Reporting System (VAERS). Your health care provider will usually file this report, or you can do it yourself. Visit the VAERS website at www.vaers.hhs.gov or call 1-800-822-7967. VAERS is only for reporting reactions, and VAERS staff members do not give medical advice. 6. How can I learn more? Ask your health care provider. Call your local or state health department. Visit the website of the Food and Drug Administration (FDA) for vaccine package inserts and additional information at www.fda.gov/vaccinesblood-biologics/vaccines. Contact the Centers for Disease Control and Prevention (CDC): Call 1-800-232-4636 (1-800-CDC-INFO) or Visit CDC's website at www.cdc.gov/vaccines. Source: CDC Vaccine Information Statement Recombinant Zoster Vaccine (08/25/2020) This same material is available at www.cdc.gov for no charge. This information is not intended to replace advice given to you by your health care provider. Make sure you discuss any questions you have with your health care provider. Document Revised: 04/17/2022 Document Reviewed: 04/17/2022 Elsevier Patient Education  2023 Elsevier Inc.  

## 2022-11-29 ENCOUNTER — Ambulatory Visit: Payer: Self-pay

## 2022-12-01 DIAGNOSIS — G8929 Other chronic pain: Secondary | ICD-10-CM | POA: Insufficient documentation

## 2022-12-01 DIAGNOSIS — M25562 Pain in left knee: Secondary | ICD-10-CM | POA: Insufficient documentation

## 2022-12-11 NOTE — Telephone Encounter (Signed)
Your prior authorization request has been denied.  Message from plan: Request Reference Number: [id number]. WEGOVY INJ 0.25MG  is denied for not meeting the prior authorization requirement(s). Details of this decision have been faxed to you.

## 2022-12-20 ENCOUNTER — Ambulatory Visit
Admission: RE | Admit: 2022-12-20 | Discharge: 2022-12-20 | Disposition: A | Discharge status: Home / Self Care | Payer: No Typology Code available for payment source | Source: Ambulatory Visit | Attending: Nurse Practitioner | Admitting: Nurse Practitioner

## 2022-12-20 DIAGNOSIS — Z1231 Encounter for screening mammogram for malignant neoplasm of breast: Secondary | ICD-10-CM

## 2022-12-25 ENCOUNTER — Other Ambulatory Visit: Payer: Self-pay | Admitting: Nurse Practitioner

## 2022-12-25 DIAGNOSIS — R928 Other abnormal and inconclusive findings on diagnostic imaging of breast: Secondary | ICD-10-CM

## 2023-01-10 ENCOUNTER — Other Ambulatory Visit: Payer: No Typology Code available for payment source

## 2023-01-22 ENCOUNTER — Other Ambulatory Visit: Payer: No Typology Code available for payment source

## 2023-01-28 ENCOUNTER — Other Ambulatory Visit: Payer: Self-pay | Admitting: Nurse Practitioner

## 2023-01-28 DIAGNOSIS — M25562 Pain in left knee: Secondary | ICD-10-CM

## 2023-03-21 ENCOUNTER — Ambulatory Visit: Admission: RE | Admit: 2023-03-21 | Payer: No Typology Code available for payment source | Source: Ambulatory Visit

## 2023-03-21 ENCOUNTER — Ambulatory Visit
Admission: RE | Admit: 2023-03-21 | Discharge: 2023-03-21 | Disposition: A | Discharge status: Home / Self Care | Payer: No Typology Code available for payment source | Source: Ambulatory Visit | Attending: Nurse Practitioner | Admitting: Nurse Practitioner

## 2023-03-21 DIAGNOSIS — R928 Other abnormal and inconclusive findings on diagnostic imaging of breast: Secondary | ICD-10-CM

## 2023-09-11 ENCOUNTER — Encounter: Payer: No Typology Code available for payment source | Admitting: Family Medicine

## 2023-09-25 ENCOUNTER — Encounter: Payer: No Typology Code available for payment source | Admitting: Family Medicine

## 2023-10-17 ENCOUNTER — Ambulatory Visit (INDEPENDENT_AMBULATORY_CARE_PROVIDER_SITE_OTHER): Payer: No Typology Code available for payment source | Admitting: Family Medicine

## 2023-10-17 ENCOUNTER — Encounter: Payer: Self-pay | Admitting: Family Medicine

## 2023-10-17 VITALS — BP 120/82 | HR 57 | Temp 98.9°F | Wt 265.4 lb

## 2023-10-17 DIAGNOSIS — Z0000 Encounter for general adult medical examination without abnormal findings: Secondary | ICD-10-CM

## 2023-10-17 DIAGNOSIS — E782 Mixed hyperlipidemia: Secondary | ICD-10-CM

## 2023-10-17 DIAGNOSIS — Z23 Encounter for immunization: Secondary | ICD-10-CM

## 2023-10-17 DIAGNOSIS — G8929 Other chronic pain: Secondary | ICD-10-CM

## 2023-10-17 DIAGNOSIS — M25562 Pain in left knee: Secondary | ICD-10-CM

## 2023-10-17 DIAGNOSIS — M25561 Pain in right knee: Secondary | ICD-10-CM

## 2023-10-17 DIAGNOSIS — Z6841 Body Mass Index (BMI) 40.0 and over, adult: Secondary | ICD-10-CM

## 2023-10-17 DIAGNOSIS — E66813 Obesity, class 3: Secondary | ICD-10-CM

## 2023-10-17 DIAGNOSIS — Z2821 Immunization not carried out because of patient refusal: Secondary | ICD-10-CM | POA: Diagnosis not present

## 2023-10-17 MED ORDER — WEGOVY 0.25 MG/0.5ML ~~LOC~~ SOAJ: 0.2500 mg | SUBCUTANEOUS | 1 refills | Status: DC

## 2023-10-17 MED ORDER — MELOXICAM 7.5 MG PO TABS: ORAL_TABLET | ORAL | 2 refills | Status: DC

## 2023-10-17 NOTE — Progress Notes (Signed)
 I,[name], CMA,acting as a Neurosurgeon for [employer], NP.,have documented all relevant documentation on the behalf of Ellender Hose, NP,as directed by  Ellender Hose, NP while in the presence of Ellender Hose, NP.  Subjective:    Patient ID: Lorraine Evans , female    DOB: 1966-02-03 , 58 y.o.   MRN: [id number]  Chief Complaint  Patient presents with   Annual Exam    HPI  Patient is a 58 year old female who presents today for her annual physical examination. Patient reports that she is having recurrent knee pain and would like a referral to orthopedics. Patient denies having shortness of breathe, chest pain or headaches at this time. [facility]. Patient reports her last pap smear was last year.     Past Medical History:  Diagnosis Date   Anemia    H/O varicella    History of measles      Family History  Problem Relation Age of Onset   Hypertension Mother      Current Outpatient Medications:    Cholecalciferol (VITAMIN D PO), Take 1 capsule by mouth daily., Disp: , Rfl:    gabapentin (NEURONTIN) 100 MG capsule, Take 1 capsule (100 mg total) by mouth daily. May titrate up to 300 mg as tolerated, Disp: 30 capsule, Rfl: 2   Iron, Ferrous Sulfate, 325 (65 Fe) MG TABS, Take 1 tablet by mouth daily., Disp: 30 tablet, Rfl: 3   latanoprost (XALATAN) 0.005 % ophthalmic solution, SMARTSIG:In Eye(s), Disp: , Rfl:    Multiple Vitamin (MULTIVITAMIN WITH MINERALS) TABS tablet, Take 1 tablet by mouth daily., Disp: , Rfl:    Semaglutide-Weight Management (WEGOVY) 0.25 MG/0.5ML SOAJ, Inject 0.25 mg into the skin every 7 (seven) days., Disp: 2 mL, Rfl: 1   vitamin C (ASCORBIC ACID) 500 MG tablet, Take 500 mg by mouth daily., Disp: , Rfl:    meloxicam (MOBIC) 7.5 MG tablet, Take 1 tab by mouth daily, Disp: 30 tablet, Rfl: 2   No Known Allergies     Social History   Tobacco Use  Smoking Status Never  Smokeless Tobacco Never   Social  History   Substance and Sexual Activity  Alcohol Use No      Review of Systems  Constitutional: Negative.   HENT: Negative.    Eyes: Negative.   Respiratory: Negative.    Cardiovascular: Negative.   Gastrointestinal: Negative.   Endocrine: Negative.   Genitourinary: Negative.   Musculoskeletal:  Positive for arthralgias.  Skin: Negative.   Allergic/Immunologic: Negative.   Neurological: Negative.   Hematological: Negative.   Psychiatric/Behavioral: Negative.       Today's Vitals   10/17/23 0832 10/17/23 0841  BP: 130/84 120/82  Pulse: (!) 57   Temp: 98.9 F (37.2 C)   TempSrc: Oral   Weight: 265 lb 6.4 oz (120.4 kg)   PainSc: 0-No pain    Body mass index is 42.84 kg/m.  Wt Readings from Last 3 Encounters:  10/17/23 265 lb 6.4 oz (120.4 kg)  11/22/22 274 lb (124.3 kg)  11/14/22 274 lb (124.3 kg)     Objective:  Physical Exam HENT:     Head: Normocephalic.  Cardiovascular:     Rate and Rhythm: Regular rhythm. Bradycardia present.     Pulses: Normal pulses.  Pulmonary:     Effort: Pulmonary effort is normal.     Breath sounds: Normal breath sounds.  Abdominal:     General: Bowel  sounds are normal.  Skin:    General: Skin is warm and dry.  Neurological:     General: No focal deficit present.     Mental Status: She is alert and oriented to person, place, and time.  Psychiatric:        Mood and Affect: Mood normal.        Behavior: Behavior normal.         Assessment And Plan:     Encounter for general adult medical examination w/o abnormal findings  Mixed hyperlipidemia Assessment & Plan: Low fat diet advised  Orders: -     CBC -     CMP14+EGFR -     Lipid panel  Need for Tdap vaccination -     Tdap vaccine greater than or equal to 7yo IM  Influenza vaccination declined  Chronic pain of both knees Assessment & Plan: Use meloxicam as needed. Referred to orthopedics.  Orders: -     Meloxicam; Take 1 tab by mouth daily  Dispense: 30  tablet; Refill: 2 -     Ambulatory referral to Orthopedic Surgery  Class 3 severe obesity due to excess calories with serious comorbidity and body mass index (BMI) of 40.0 to 44.9 in adult [facility]) Assessment & Plan: She is encouraged to strive for BMI less than 30 to decrease cardiac risk. Advised to aim for at least 150 minutes of exercise per week.   Orders: -     Wegovy; Inject 0.25 mg into the skin every 7 (seven) days.  Dispense: 2 mL; Refill: 1     Return for 1 year physical. Patient was given opportunity to ask questions. Patient verbalized understanding of the plan and was able to repeat key elements of the plan. All questions were answered to their satisfaction.   I, Ellender Hose, NP, have reviewed all documentation for this visit. The documentation on 10/17/2023 for the exam, diagnosis, procedures, and orders are all accurate and complete.

## 2023-10-17 NOTE — Assessment & Plan Note (Signed)
 She is encouraged to strive for BMI less than 30 to decrease cardiac risk. Advised to aim for at least 150 minutes of exercise per week.

## 2023-10-17 NOTE — Patient Instructions (Signed)

## 2023-10-17 NOTE — Assessment & Plan Note (Signed)
 Use meloxicam as needed. Referred to orthopedics.

## 2023-10-17 NOTE — Assessment & Plan Note (Signed)
 Low fat diet advised.

## 2023-10-18 LAB — CBC
Hematocrit: 37.1 % (ref 34.0–46.6)
Hemoglobin: 12.4 g/dL (ref 11.1–15.9)
MCH: 29 pg (ref 26.6–33.0)
MCHC: 33.4 g/dL (ref 31.5–35.7)
MCV: 87 fL (ref 79–97)
Platelets: 259 10*3/uL (ref 150–450)
RBC: 4.28 x10E6/uL (ref 3.77–5.28)
RDW: 13.9 % (ref 11.7–15.4)
WBC: 7.9 10*3/uL (ref 3.4–10.8)

## 2023-10-18 LAB — CMP14+EGFR
ALT: 15 IU/L (ref 0–32)
AST: 20 IU/L (ref 0–40)
Albumin: 4.4 g/dL (ref 3.8–4.9)
Alkaline Phosphatase: 103 IU/L (ref 44–121)
BUN/Creatinine Ratio: 33 — ABNORMAL HIGH (ref 9–23)
BUN: 20 mg/dL (ref 6–24)
Bilirubin Total: 0.2 mg/dL (ref 0.0–1.2)
CO2: 24 mmol/L (ref 20–29)
Calcium: 9.9 mg/dL (ref 8.7–10.2)
Chloride: 104 mmol/L (ref 96–106)
Creatinine, Ser: 0.61 mg/dL (ref 0.57–1.00)
Globulin, Total: 3.5 g/dL (ref 1.5–4.5)
Glucose: 82 mg/dL (ref 70–99)
Potassium: 4.6 mmol/L (ref 3.5–5.2)
Sodium: 143 mmol/L (ref 134–144)
Total Protein: 7.9 g/dL (ref 6.0–8.5)
eGFR: 104 mL/min/{1.73_m2} (ref >59)

## 2023-10-18 LAB — LIPID PANEL
Chol/HDL Ratio: 3.6 ratio (ref 0.0–4.4)
Cholesterol, Total: 204 mg/dL — ABNORMAL HIGH (ref 100–199)
HDL: 57 mg/dL (ref >39)
LDL Chol Calc (NIH): 128 mg/dL — ABNORMAL HIGH (ref 0–99)
Triglycerides: 107 mg/dL (ref 0–149)
VLDL Cholesterol Cal: 19 mg/dL (ref 5–40)

## 2023-11-25 ENCOUNTER — Other Ambulatory Visit: Payer: Self-pay | Admitting: Nurse Practitioner

## 2023-11-25 DIAGNOSIS — Z1231 Encounter for screening mammogram for malignant neoplasm of breast: Secondary | ICD-10-CM

## 2023-11-27 ENCOUNTER — Other Ambulatory Visit: Payer: Self-pay | Admitting: Nurse Practitioner

## 2023-11-27 ENCOUNTER — Telehealth: Payer: Self-pay

## 2023-11-27 DIAGNOSIS — E66813 Obesity, class 3: Secondary | ICD-10-CM

## 2023-11-27 IMAGING — MG MM DIGITAL SCREENING BILAT W/ TOMO AND CAD
8 series · 8 of 24 positions shown · non-contrast
Comparison: Previous exam(s).

CLINICAL DATA: Screening.

EXAM:
DIGITAL SCREENING BILATERAL MAMMOGRAM WITH TOMOSYNTHESIS AND CAD
TECHNIQUE: Bilateral screening digital craniocaudal and mediolateral oblique
mammograms were obtained. Bilateral screening digital breast
tomosynthesis was performed. The images were evaluated with
computer-aided detection.

[L MLO synth-2D]
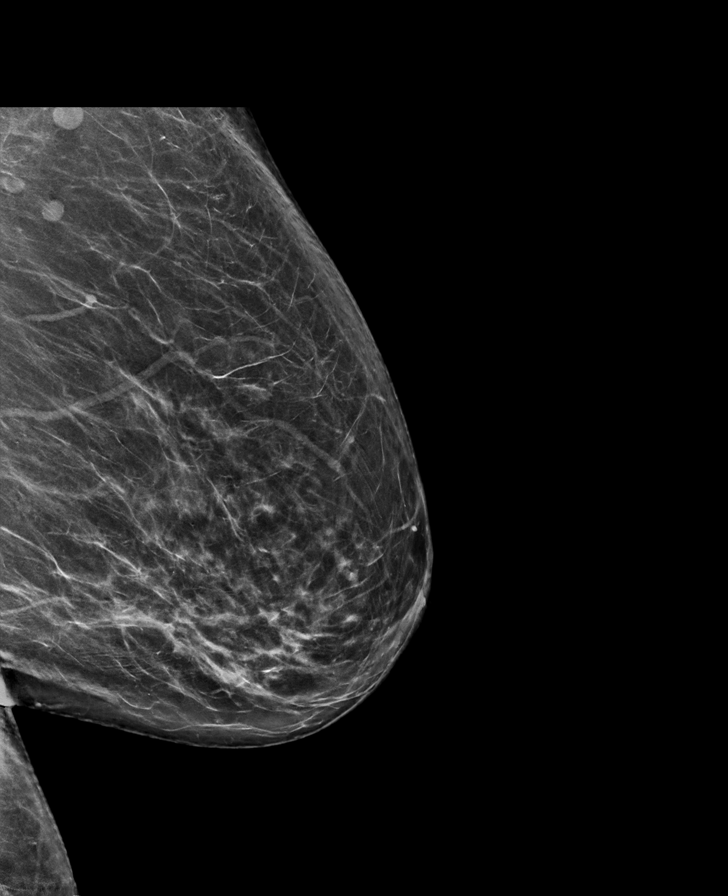

[R CC synth-2D]
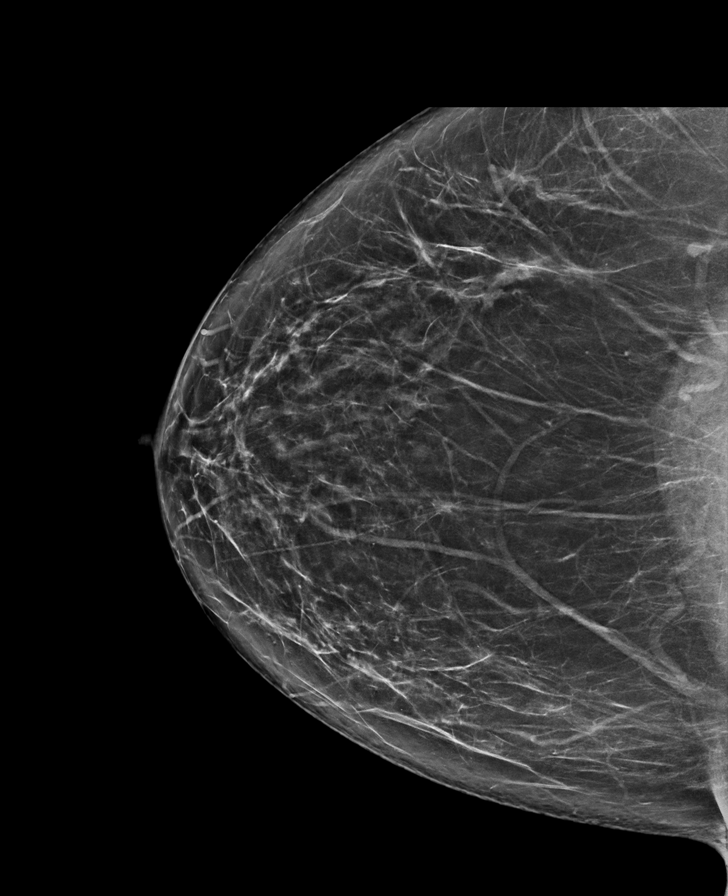

[R MLO synth-2D]
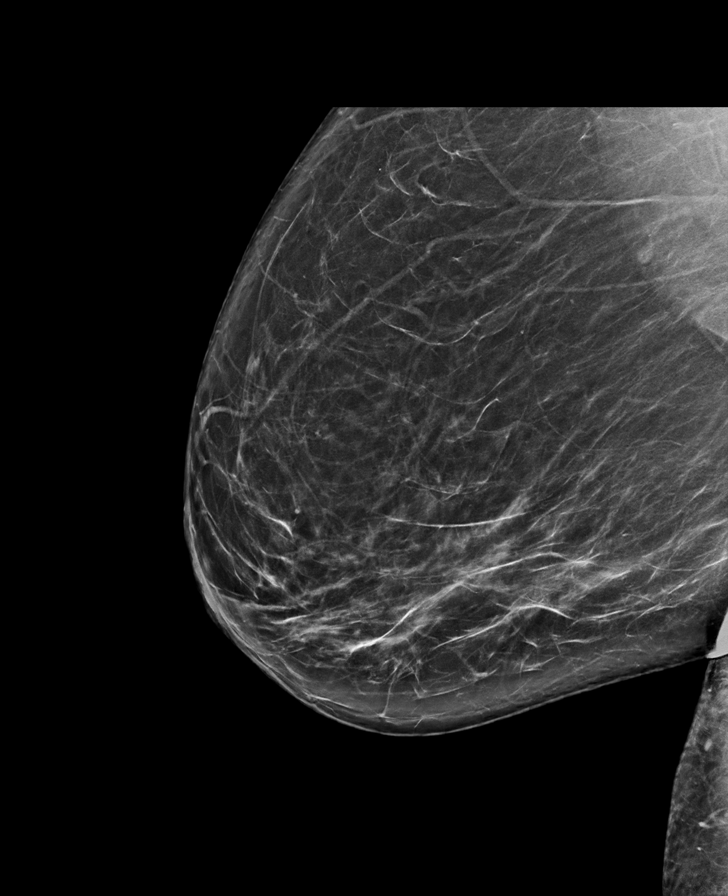

[L CC synth-2D]
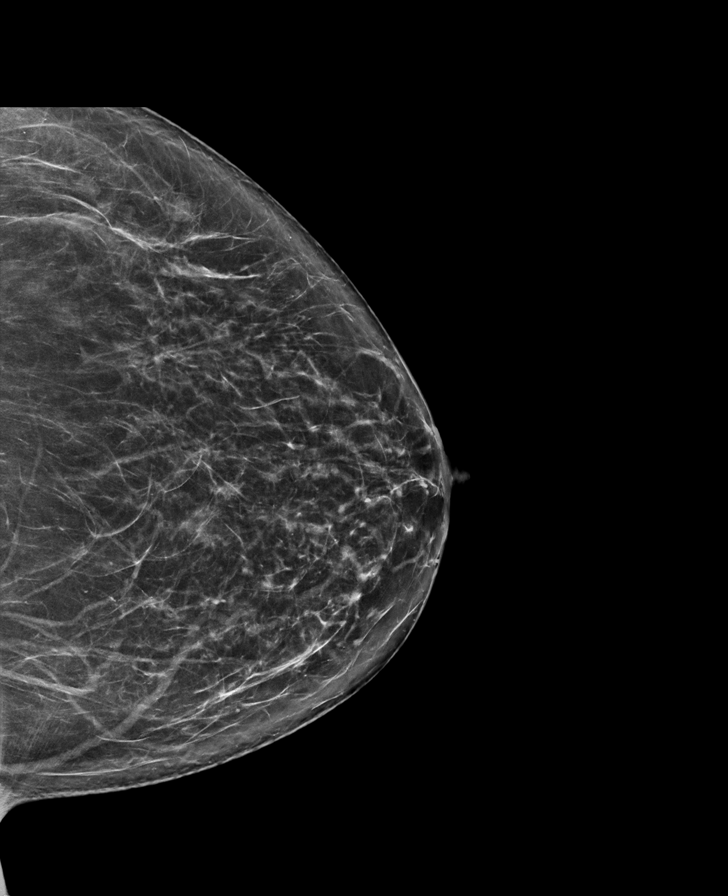

[R MLO tomo · tomo slice 43/85.0]
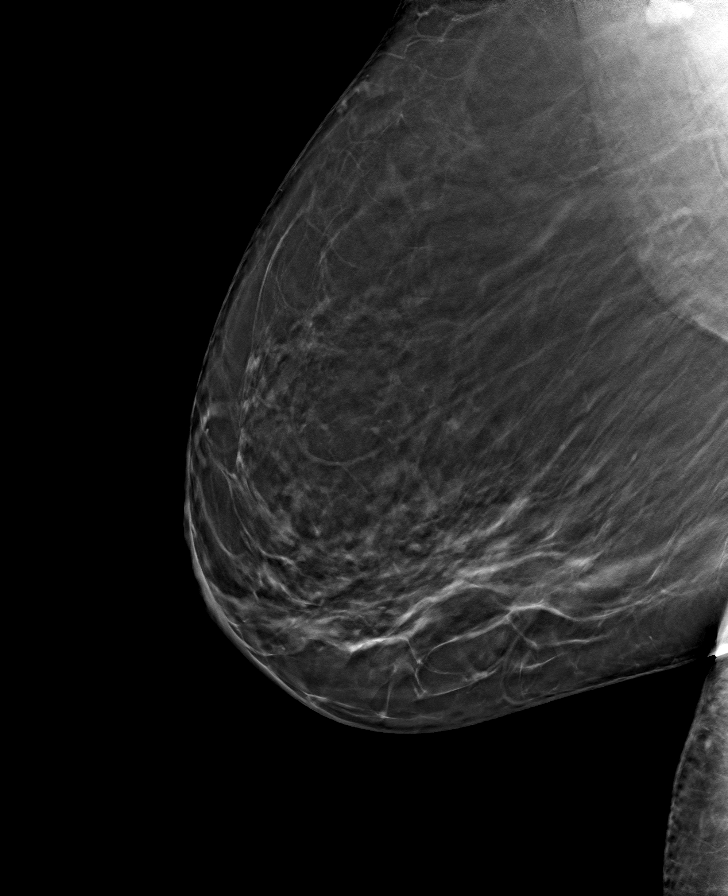

[R CC tomo · tomo slice 41/80.0]
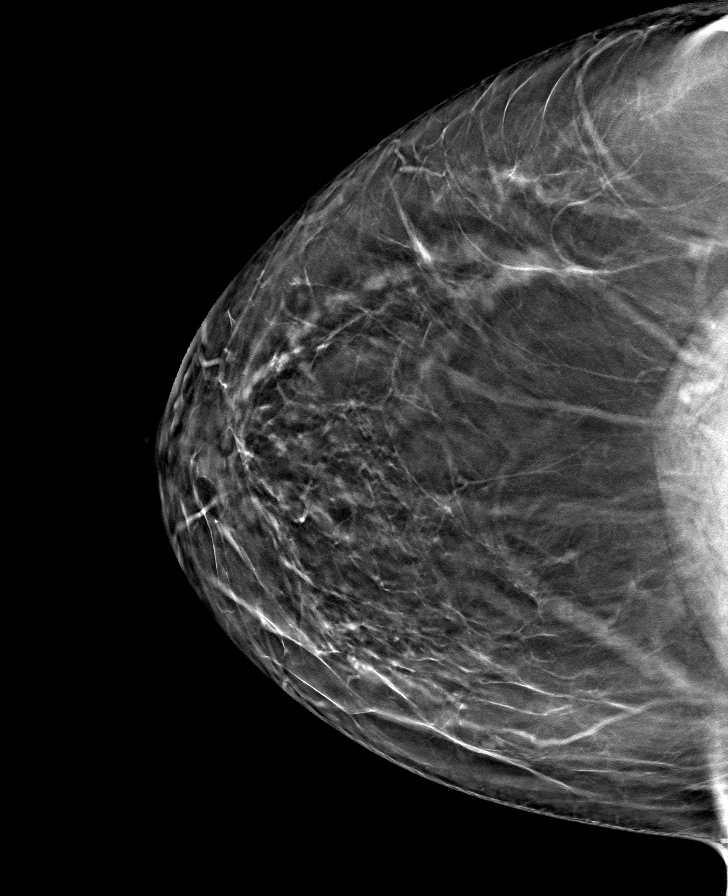

[L CC tomo · tomo slice 37/74.0]
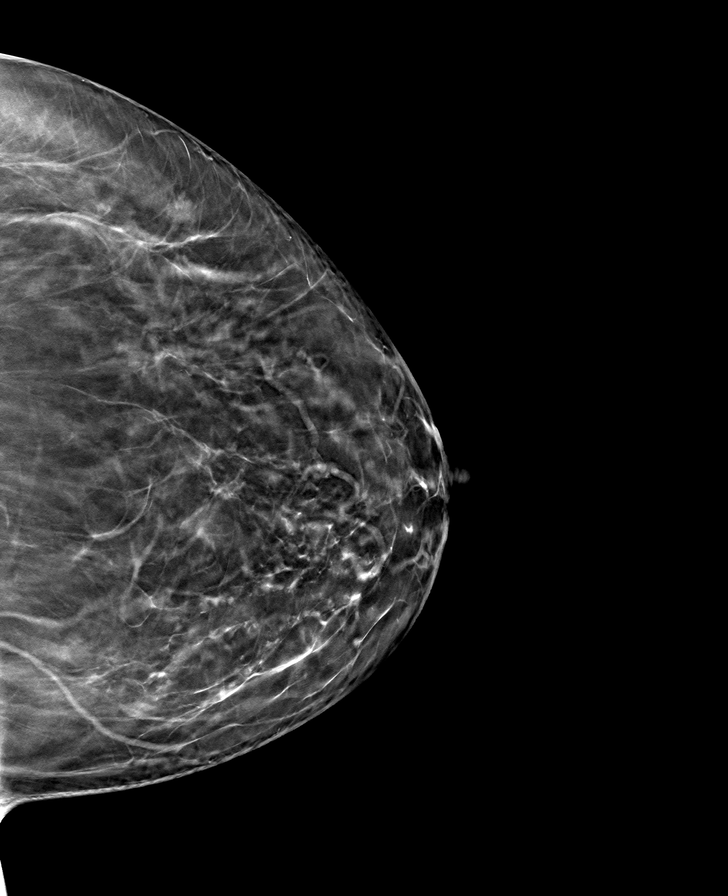

[L MLO tomo · tomo slice 42/83.0]
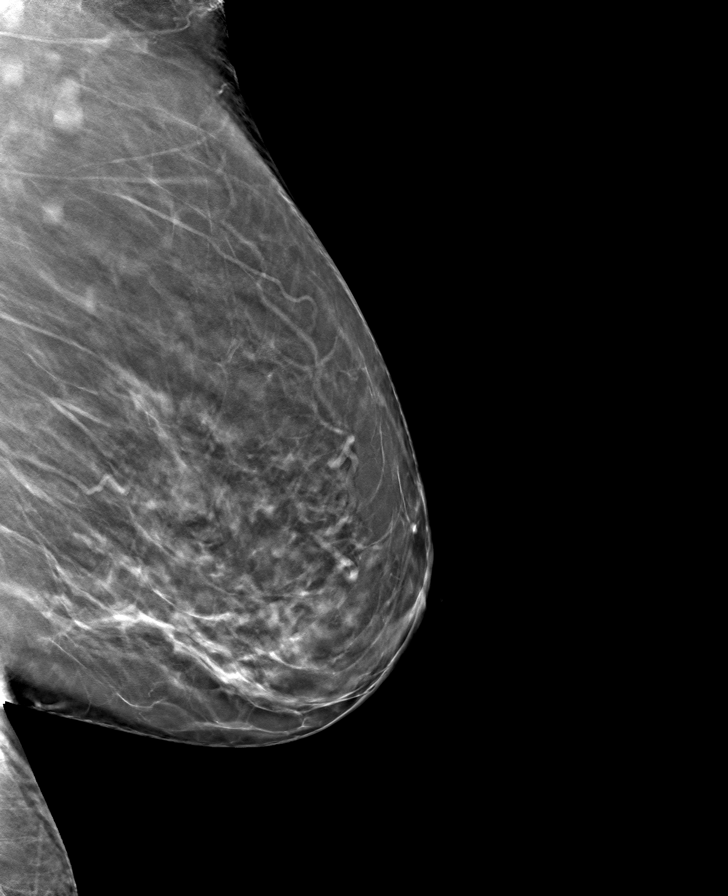

[8 of 24 positions shown; findings below may reference images not displayed]

ACR Breast Density Category b: There are scattered areas of
fibroglandular density.
FINDINGS: There are no findings suspicious for malignancy.
IMPRESSION: No mammographic evidence of malignancy. A result letter of this
screening mammogram will be mailed directly to the patient.

RECOMMENDATION:
Screening mammogram in one year. (Code:51-O-LD2)

BI-RADS CATEGORY  1: Negative.

## 2023-11-27 MED ORDER — WEGOVY 0.25 MG/0.5ML ~~LOC~~ SOAJ: 0.2500 mg | SUBCUTANEOUS | 1 refills | Status: DC

## 2023-11-27 NOTE — Telephone Encounter (Signed)
 Copied from CRM [id number]. Topic: Referral - Status >> Nov 27, 2023  1:14 PM Carlatta H wrote: Reason for CRM: Patient would like to status on referral request for [facility] for knee  LVM to let patient know she will need appointment for referral.

## 2023-11-27 NOTE — Telephone Encounter (Signed)
 Copied from CRM [id number]. Topic: Clinical - Medication Refill >> Nov 27, 2023  1:12 PM Carlatta H wrote: Medication: Semaglutide -Weight Management (WEGOVY ) 0.25 MG/0.5ML [facility] [[id number]]  Has the patient contacted their pharmacy? No (Agent: If no, request that the patient contact the pharmacy for the refill. If patient does not wish to contact the pharmacy document the reason why and proceed with request.) (Agent: If yes, when and what did the pharmacy advise?)  This is the patient's preferred pharmacy:  [id number] Jonette Nestle, [place] - [address] AT [facility] [address] [place] [address] Phone: [phone] Fax: [phone]  Is this the correct pharmacy for this prescription? Yes If no, delete pharmacy and type the correct one.   Has the prescription been filled recently? Yes  Is the patient out of the medication? Yes  Has the patient been seen for an appointment in the last year OR does the patient have an upcoming appointment? Yes  Can we respond through MyChart? No  Agent: Please be advised that Rx refills may take up to 3 business days. We ask that you follow-up with your pharmacy.

## 2023-12-17 ENCOUNTER — Encounter: Payer: Self-pay | Admitting: Nurse Practitioner

## 2023-12-17 ENCOUNTER — Ambulatory Visit (INDEPENDENT_AMBULATORY_CARE_PROVIDER_SITE_OTHER): Payer: Self-pay | Admitting: Nurse Practitioner

## 2023-12-17 VITALS — BP 110/80 | HR 81 | Temp 98.0°F | Ht 66.0 in | Wt 267.8 lb

## 2023-12-17 DIAGNOSIS — M25561 Pain in right knee: Secondary | ICD-10-CM | POA: Diagnosis not present

## 2023-12-17 DIAGNOSIS — J069 Acute upper respiratory infection, unspecified: Secondary | ICD-10-CM | POA: Diagnosis not present

## 2023-12-17 DIAGNOSIS — M25562 Pain in left knee: Secondary | ICD-10-CM | POA: Diagnosis not present

## 2023-12-17 DIAGNOSIS — G8929 Other chronic pain: Secondary | ICD-10-CM

## 2023-12-17 DIAGNOSIS — Z2821 Immunization not carried out because of patient refusal: Secondary | ICD-10-CM

## 2023-12-17 DIAGNOSIS — E66813 Obesity, class 3: Secondary | ICD-10-CM

## 2023-12-17 DIAGNOSIS — Z6841 Body Mass Index (BMI) 40.0 and over, adult: Secondary | ICD-10-CM

## 2023-12-17 MED ORDER — AMOXICILLIN 875 MG PO TABS: 875.0000 mg | ORAL_TABLET | Freq: Two times a day (BID) | ORAL | 0 refills | Status: DC

## 2023-12-17 MED ORDER — BENZONATATE 100 MG PO CAPS: 100.0000 mg | ORAL_CAPSULE | Freq: Four times a day (QID) | ORAL | 1 refills | Status: DC | PRN

## 2023-12-17 NOTE — Progress Notes (Signed)
 [place], CMA,acting as a Neurosurgeon for Susanna Epley, FNP.,have documented all relevant documentation on the behalf of Susanna Epley, FNP,as directed by  Susanna Epley, FNP while in the presence of Susanna Epley, FNP.  Subjective:  Patient ID: Lorraine Evans , female    DOB: 1965/08/20 , 58 y.o.   MRN: [id number]  Chief Complaint  Patient presents with   Knee Pain    Patient presents today for bilateral knee pain, patient would like to see ortho.   Sore Throat    Patient reports for the past 2 weeks she has had a sore throat, cough, and congestion. She tried OTC medications with no relief.     HPI  She has been having a sore throat for 2 weeks, she has also had hoarseness. She has been coughing. She feels like she is congested with mucous. She does not have a fever. She has taken [facility] Allergy medication once a day, she is also using a nasal spray.      Past Medical History:  Diagnosis Date   Anemia    H/O varicella    History of measles      Family History  Problem Relation Age of Onset   Hypertension Mother      Current Outpatient Medications:    amoxicillin  (AMOXIL ) 875 MG tablet, Take 1 tablet (875 mg total) by mouth 2 (two) times daily., Disp: 14 tablet, Rfl: 0   benzonatate  (TESSALON  PERLES) 100 MG capsule, Take 1 capsule (100 mg total) by mouth every 6 (six) hours as needed., Disp: 30 capsule, Rfl: 1   Cholecalciferol (VITAMIN D PO), Take 1 capsule by mouth daily., Disp: , Rfl:    gabapentin  (NEURONTIN ) 100 MG capsule, Take 1 capsule (100 mg total) by mouth daily. May titrate up to 300 mg as tolerated, Disp: 30 capsule, Rfl: 2   Iron , Ferrous Sulfate , 325 (65 Fe) MG TABS, Take 1 tablet by mouth daily., Disp: 30 tablet, Rfl: 3   latanoprost (XALATAN) 0.005 % ophthalmic solution, SMARTSIG:In Eye(s), Disp: , Rfl:    meloxicam  (MOBIC ) 7.5 MG tablet, Take 1 tab by mouth daily, Disp: 30 tablet, Rfl: 2   Multiple Vitamin (MULTIVITAMIN WITH MINERALS) TABS tablet, Take 1  tablet by mouth daily., Disp: , Rfl:    vitamin C (ASCORBIC ACID) 500 MG tablet, Take 500 mg by mouth daily., Disp: , Rfl:    Semaglutide -Weight Management (WEGOVY ) 0.25 MG/0.5ML SOAJ, Inject 0.25 mg into the skin every 7 (seven) days. (Patient not taking: Reported on 12/17/2023), Disp: 2 mL, Rfl: 1   No Known Allergies   Review of Systems  Constitutional: Negative.   HENT:  Positive for congestion and sore throat.   Respiratory: Negative.    Cardiovascular: Negative.   Gastrointestinal: Negative.   Neurological: Negative.   Psychiatric/Behavioral: Negative.       Today's Vitals   12/17/23 1204  BP: 110/80  Pulse: 81  Temp: 98 F (36.7 C)  TempSrc: Oral  Weight: 267 lb 12.8 oz (121.5 kg)  Height: 5\' 6"  (1.676 m)  PainSc: 0-No pain   Body mass index is 43.22 kg/m.  Wt Readings from Last 3 Encounters:  12/17/23 267 lb 12.8 oz (121.5 kg)  10/17/23 265 lb 6.4 oz (120.4 kg)  11/22/22 274 lb (124.3 kg)    Objective:  Physical Exam Vitals reviewed.  Constitutional:      General: She is not in acute distress.    Appearance: She is well-developed. She is obese.  HENT:  Right Ear: Tympanic membrane and ear canal normal.     Left Ear: Tympanic membrane and ear canal normal.     Nose: Congestion present.     Mouth/Throat:     Mouth: Mucous membranes are moist.  Cardiovascular:     Rate and Rhythm: Normal rate and regular rhythm.     Heart sounds: Normal heart sounds. No murmur heard. Pulmonary:     Effort: Pulmonary effort is normal. No respiratory distress.     Breath sounds: Normal breath sounds.  Skin:    General: Skin is warm and dry.  Neurological:     General: No focal deficit present.     Mental Status: She is alert and oriented to person, place, and time.  Psychiatric:        Mood and Affect: Mood normal.        Behavior: Behavior normal.     Assessment And Plan:  Chronic pain of both knees Assessment & Plan: Will make a new referral as the last one they  were unable to make contact.  Orders: -     Ambulatory referral to Orthopedic Surgery  COVID-19 vaccination declined Assessment & Plan: Declines covid 19 vaccine. Discussed risk of covid 79 and if she changes her mind about the vaccine to call the office. Education has been provided regarding the importance of this vaccine but patient still declined. Advised may receive this vaccine at local pharmacy or Health Dept.or vaccine clinic. Aware to provide a copy of the vaccination record if obtained from local pharmacy or Health Dept.  Encouraged to take multivitamin, vitamin d, vitamin c and zinc to increase immune system. Aware can call office if would like to have vaccine here at office. Verbalized acceptance and understanding.    Upper respiratory tract infection, unspecified type Assessment & Plan: 2 week history of sinus congestion, will treat with amoxicillin . If not better return call to office  Orders: -     Benzonatate ; Take 1 capsule (100 mg total) by mouth every 6 (six) hours as needed.  Dispense: 30 capsule; Refill: 1 -     Amoxicillin ; Take 1 tablet (875 mg total) by mouth 2 (two) times daily.  Dispense: 14 tablet; Refill: 0  Class 3 severe obesity due to excess calories with serious comorbidity and body mass index (BMI) of 40.0 to 44.9 in adult Assessment & Plan: She is encouraged to strive for BMI less than 30 to decrease cardiac risk. Advised to aim for at least 150 minutes of exercise per week.      Return in about 4 months (around 04/18/2024) for chol.  Patient was given opportunity to ask questions. Patient verbalized understanding of the plan and was able to repeat key elements of the plan. All questions were answered to their satisfaction.    Inge Mangle, FNP, have reviewed all documentation for this visit. The documentation on 12/17/23 for the exam, diagnosis, procedures, and orders are all accurate and complete.   IF YOU HAVE BEEN REFERRED TO A SPECIALIST, IT  MAY TAKE 1-2 WEEKS TO SCHEDULE/PROCESS THE REFERRAL. IF YOU HAVE NOT HEARD FROM US /SPECIALIST IN TWO WEEKS, PLEASE GIVE US  A CALL AT [phone].

## 2023-12-17 NOTE — Patient Instructions (Signed)
 Please call this number to schedule with the orthopedic [phone]

## 2023-12-20 DIAGNOSIS — J069 Acute upper respiratory infection, unspecified: Secondary | ICD-10-CM | POA: Insufficient documentation

## 2023-12-20 DIAGNOSIS — Z2821 Immunization not carried out because of patient refusal: Secondary | ICD-10-CM | POA: Insufficient documentation

## 2023-12-20 NOTE — Assessment & Plan Note (Signed)

## 2023-12-20 NOTE — Assessment & Plan Note (Signed)
 Will make a new referral as the last one they were unable to make contact.

## 2023-12-20 NOTE — Assessment & Plan Note (Signed)
 She is encouraged to strive for BMI less than 30 to decrease cardiac risk. Advised to aim for at least 150 minutes of exercise per week.

## 2023-12-20 NOTE — Assessment & Plan Note (Signed)
 2 week history of sinus congestion, will treat with amoxicillin . If not better return call to office

## 2023-12-23 ENCOUNTER — Ambulatory Visit

## 2024-01-01 ENCOUNTER — Ambulatory Visit: Payer: Self-pay | Admitting: Physician Assistant

## 2024-01-06 ENCOUNTER — Ambulatory Visit: Payer: Self-pay | Admitting: Physician Assistant

## 2024-01-12 ENCOUNTER — Other Ambulatory Visit (INDEPENDENT_AMBULATORY_CARE_PROVIDER_SITE_OTHER)

## 2024-01-12 ENCOUNTER — Ambulatory Visit (INDEPENDENT_AMBULATORY_CARE_PROVIDER_SITE_OTHER): Payer: Self-pay | Admitting: Physician Assistant

## 2024-01-12 ENCOUNTER — Encounter: Payer: Self-pay | Admitting: Physician Assistant

## 2024-01-12 DIAGNOSIS — M25562 Pain in left knee: Secondary | ICD-10-CM

## 2024-01-12 DIAGNOSIS — M170 Bilateral primary osteoarthritis of knee: Secondary | ICD-10-CM | POA: Insufficient documentation

## 2024-01-12 DIAGNOSIS — M25561 Pain in right knee: Secondary | ICD-10-CM

## 2024-01-12 DIAGNOSIS — G8929 Other chronic pain: Secondary | ICD-10-CM

## 2024-01-12 NOTE — Progress Notes (Signed)
 Office Visit Note   Patient: Lorraine Evans           Date of Birth: [birth year]           MRN: [id number] Visit Date: 01/12/2024              Requested by: Georgina Speaks, FNP [address] [place],  [place] [address] PCP: Georgina Speaks, FNP   Assessment & Plan: Visit Diagnoses:  1. Chronic pain of right knee   2. Chronic pain of left knee   3. Primary osteoarthritis of both knees     Plan: [name] is a pleasant active 58 year old woman with a 2-year history of right greater than left knee pain.  She denies any injuries.  She does say that she has had her weight go up and down and little her weight is lower she feels better.  Does not hurt a lot today but notices most going up and down stairs.  X-rays today demonstrate right greater than left tricompartmental arthritis.  She also has valgus malalignment which is worse on the right.  We talked about the natural history of this.  I gave her some exercises to try.  We could do an injection she will think about this.  Follow-Up Instructions: No follow-ups on file.   Orders:  Orders Placed This Encounter  Procedures   XR KNEE 3 VIEW RIGHT   XR KNEE 3 VIEW LEFT   No orders of the defined types were placed in this encounter.     Procedures: No procedures performed   Clinical Data: No additional findings.   Subjective: No chief complaint on file.   HPI Lorraine Evans is a pleasant 58 year old woman who comes in today with a chief complaint of right worse than left knee pain.  Has been going on for about 2 years.  She did gain some weight and became worse.  She takes meloxicam  cam she finds climbing stairs is somewhat painful.  Review of Systems  All other systems reviewed and are negative.    Objective: Vital Signs: LMP  (LMP Unknown)   Physical Exam Constitutional:      Appearance: Normal appearance.  Pulmonary:     Effort: Pulmonary effort is normal.   Skin:    General: Skin is warm and dry.    Neurological:     General: No focal deficit present.     Mental Status: She is alert and oriented to person, place, and time.   Psychiatric:        Mood and Affect: Mood normal.        Behavior: Behavior normal.     Ortho Exam Bilateral knees she is neurovascularly intact compartments are soft and nontender she does have grinding on the right greater than left and valgus alignment right on the worse than the left.  She has good stability with anterior posterior draw medial lateral stability.  Tender on the right on the lateral joint line more than the patellofemoral joint or medially on the left she has some tenderness medially.  Negative Homans' sign distal pulses are intact Specialty Comments:  No specialty comments available.  Imaging: XR KNEE 3 VIEW RIGHT Result Date: 01/12/2024 Three-view radiographs of the right knee tricompartmental arthritis with joint space narrowing periarticular osteophyte and sclerotic changes most noted at the patellofemoral and lateral joint  XR KNEE 3 VIEW LEFT Result Date: 01/12/2024 Three-view radiographs of the left knee demonstrate tricompartmental arthritis with sclerotic changes small periarticular osteophytes especially laterally.  Arthritis most pronounced at the patellofemoral joint    PMFS History: Patient Active Problem List   Diagnosis Date Noted   Osteoarthritis of knees, bilateral 01/12/2024   COVID-19 vaccination declined 12/20/2023   Upper respiratory tract infection 12/20/2023   Encounter for general adult medical examination w/o abnormal findings 10/17/2023   Need for Tdap vaccination 10/17/2023   Influenza vaccination declined 10/17/2023   Chronic pain of both knees 10/17/2023   Chronic knee pain 12/01/2022   Hyperglycemia 11/14/2022   Class 3 severe obesity due to excess calories with serious comorbidity and body mass index (BMI) of 40.0 to 44.9 in adult 11/14/2022   History of colonic polyps 11/14/2022   Mixed  hyperlipidemia 11/14/2022   Elevated blood-pressure reading without diagnosis of hypertension 02/09/2019   Anemia 02/09/2019   Past Medical History:  Diagnosis Date   Anemia    H/O varicella    History of measles     Family History  Problem Relation Age of Onset   Hypertension Mother     Past Surgical History:  Procedure Laterality Date   CESAREAN SECTION     DILATION AND CURETTAGE OF UTERUS  1987   DILITATION & CURRETTAGE/HYSTROSCOPY WITH HYDROTHERMAL ABLATION N/A 12/03/2017   Procedure: DILATATION & CURETTAGE/HYSTEROSCOPY WITH HYDROTHERMAL ABLATION;  Surgeon: Timmie Norris, MD;  Location: [facility];  Service: Gynecology;  Laterality: N/A;   gallstones removal  1995   Social History   Occupational History   Not on file  Tobacco Use   Smoking status: Never   Smokeless tobacco: Never  Vaping Use   Vaping status: Never Used  Substance and Sexual Activity   Alcohol use: No   Drug use: No   Sexual activity: Not Currently    Birth control/protection: None

## 2024-01-27 ENCOUNTER — Other Ambulatory Visit: Payer: Self-pay | Admitting: Family Medicine

## 2024-01-27 DIAGNOSIS — G8929 Other chronic pain: Secondary | ICD-10-CM

## 2024-02-05 ENCOUNTER — Ambulatory Visit
Admission: RE | Admit: 2024-02-05 | Discharge: 2024-02-05 | Disposition: A | Discharge status: Home / Self Care | Source: Ambulatory Visit | Attending: Nurse Practitioner | Admitting: Nurse Practitioner

## 2024-02-05 DIAGNOSIS — Z1231 Encounter for screening mammogram for malignant neoplasm of breast: Secondary | ICD-10-CM

## 2024-04-21 ENCOUNTER — Ambulatory Visit: Admitting: Nurse Practitioner

## 2024-04-29 ENCOUNTER — Ambulatory Visit: Payer: Self-pay | Admitting: Nurse Practitioner

## 2024-05-06 ENCOUNTER — Other Ambulatory Visit: Payer: Self-pay | Admitting: Family Medicine

## 2024-05-06 DIAGNOSIS — G8929 Other chronic pain: Secondary | ICD-10-CM

## 2024-05-24 ENCOUNTER — Encounter: Payer: Self-pay | Admitting: Radiology

## 2024-06-01 ENCOUNTER — Ambulatory Visit: Payer: Self-pay | Admitting: Nurse Practitioner

## 2024-06-01 ENCOUNTER — Encounter: Payer: Self-pay | Admitting: Nurse Practitioner

## 2024-06-01 VITALS — BP 124/80 | HR 83 | Temp 98.2°F | Ht 66.0 in | Wt 280.0 lb

## 2024-06-01 DIAGNOSIS — E66813 Obesity, class 3: Secondary | ICD-10-CM | POA: Diagnosis not present

## 2024-06-01 DIAGNOSIS — Z6841 Body Mass Index (BMI) 40.0 and over, adult: Secondary | ICD-10-CM

## 2024-06-01 DIAGNOSIS — Z23 Encounter for immunization: Secondary | ICD-10-CM

## 2024-06-01 DIAGNOSIS — E782 Mixed hyperlipidemia: Secondary | ICD-10-CM

## 2024-06-01 NOTE — Patient Instructions (Signed)

## 2024-06-01 NOTE — Progress Notes (Signed)
 I,Lorraine Evans, CMA,acting as a neurosurgeon for [employer], Lorraine Evans.,have documented all relevant documentation on the behalf of Lorraine Evans, Lorraine Evans,as directed by  Lorraine Evans, Lorraine Evans while in the presence of Lorraine Evans, Lorraine Evans.  Subjective:  Patient ID: Lorraine Evans , female    DOB: Sep 30, 1965 , 58 y.o.   MRN: [id number]  Chief Complaint  Patient presents with   Hyperlipidemia    Patient presents today for chol check. Patient reports compliance with her meds.     HPI Discussed the use of AI scribe software for clinical note transcription with the patient, who gave verbal consent to proceed.  History of Present Illness Lorraine Evans is a 59 year old female who presents for a follow-up regarding weight management and diabetes monitoring.  She has gained 12 pounds since her last visit in May, attributing the weight gain to limited physical activity and dietary habits, including consumption of rice, bread, and potatoes.  Her dietary intake includes spaghetti, salad, green leafy vegetables, and bran for fiber. She is mindful of her carbohydrate intake but notes difficulty in portion control, particularly with rice. She has not been exercising regularly and mentions trying to work out between her commitments.  She is concerned about her A1c levels, which have not been checked recently. No symptoms of increased urination, thirst, or hunger. She confirms taking her cholesterol medication, although there was some initial confusion about the specific medication. Her cholesterol levels were noted to be slightly elevated at the last check.  She recalls having a Pap smear earlier this year with her gynecologist at [facility].  Hyperlipidemia This is a chronic problem. The current episode started more than 1 year ago. The problem is controlled. Recent lipid tests were reviewed and are high. She has no history of chronic renal disease. Pertinent negatives include no chest pain. She is currently on  no antihyperlipidemic treatment. Risk factors for coronary artery disease include obesity and a sedentary lifestyle.     Past Medical History:  Diagnosis Date   Anemia    Elevated blood-pressure reading without diagnosis of hypertension 02/09/2019   H/O varicella    History of measles      Family History  Problem Relation Age of Onset   Hypertension Mother      Current Outpatient Medications:    Cholecalciferol (VITAMIN D PO), Take 1 capsule by mouth daily., Disp: , Rfl:    gabapentin  (NEURONTIN ) 100 MG capsule, Take 1 capsule (100 mg total) by mouth daily. May titrate up to 300 mg as tolerated, Disp: 30 capsule, Rfl: 2   Iron , Ferrous Sulfate , 325 (65 Fe) MG TABS, Take 1 tablet by mouth daily., Disp: 30 tablet, Rfl: 3   latanoprost (XALATAN) 0.005 % ophthalmic solution, SMARTSIG:In Eye(s), Disp: , Rfl:    meloxicam  (MOBIC ) 7.5 MG tablet, TAKE 1 TABLET BY MOUTH DAILY, Disp: 30 tablet, Rfl: 2   Multiple Vitamin (MULTIVITAMIN WITH MINERALS) TABS tablet, Take 1 tablet by mouth daily., Disp: , Rfl:    vitamin C (ASCORBIC ACID) 500 MG tablet, Take 500 mg by mouth daily., Disp: , Rfl:    No Known Allergies   Review of Systems  Constitutional: Negative.   Respiratory: Negative.    Cardiovascular:  Negative for chest pain and leg swelling.  Psychiatric/Behavioral: Negative.    All other systems reviewed and are negative.    Today's Vitals   06/01/24 1528  BP: 124/80  Pulse: 83  Temp: 98.2 F (36.8 C)  TempSrc: Oral  Weight: 280 lb (127 kg)  Height: 5' 6 (1.676 m)  PainSc: 0-No pain   Body mass index is 45.19 kg/m.  Wt Readings from Last 3 Encounters:  06/01/24 280 lb (127 kg)  12/17/23 267 lb 12.8 oz (121.5 kg)  10/17/23 265 lb 6.4 oz (120.4 kg)      Objective:  Physical Exam Vitals and nursing note reviewed.  Constitutional:      General: She is not in acute distress.    Appearance: Normal appearance. She is obese.  Cardiovascular:     Rate and Rhythm: Normal  rate.     Pulses: Normal pulses.     Heart sounds: Normal heart sounds. No murmur heard. Pulmonary:     Effort: Pulmonary effort is normal. No respiratory distress.     Breath sounds: Normal breath sounds. No wheezing.  Skin:    Capillary Refill: Capillary refill takes less than 2 seconds.  Neurological:     General: No focal deficit present.     Mental Status: She is alert and oriented to person, place, and time.     Cranial Nerves: No cranial nerve deficit.     Motor: No weakness.  Psychiatric:        Mood and Affect: Mood normal.        Behavior: Behavior normal.        Thought Content: Thought content normal.        Judgment: Judgment normal.      Assessment And Plan:   Assessment & Plan Mixed hyperlipidemia Previous elevated cholesterol levels. Discussed dietary habits and fiber intake importance. Need for influenza vaccination Influenza vaccine administered Encouraged to take Tylenol  as needed for fever or muscle aches.  Class 3 severe obesity due to excess calories with serious comorbidity and body mass index (BMI) of 45.0 to 49.9 in adult [facility]) Class 3 obesity with recent weight gain. Limited exercise and high rice intake noted. Discussed post-menopausal weight loss challenges and dietary changes. Considered structured weight loss program and potential medication approval. - [facility] for [facility] program. - Advised dietary modifications, including reducing rice portion size and considering brown rice. - Checked A1c to assess for diabetes or prediabetes.  Orders Placed This Encounter  Procedures   Flu vaccine trivalent PF, 6mos and older(Flulaval,Afluria,Fluarix,Fluzone)   CBC   CMP14+EGFR   Lipid panel   Hemoglobin A1c   Amb Ref to [facility]      Return if symptoms worsen or fail to improve.  Patient was given opportunity to ask questions. Patient verbalized understanding of the plan and was  able to repeat key elements of the plan. All questions were answered to their satisfaction.    Lorraine Evans, Lorraine Evans, have reviewed all documentation for this visit. The documentation on 06/01/24 for the exam, diagnosis, procedures, and orders are all accurate and complete.   IF YOU HAVE BEEN REFERRED TO A SPECIALIST, IT MAY TAKE 1-2 WEEKS TO SCHEDULE/PROCESS THE REFERRAL. IF YOU HAVE NOT HEARD FROM US /SPECIALIST IN TWO WEEKS, PLEASE GIVE US  A CALL AT [phone] X [phone].

## 2024-06-02 LAB — CMP14+EGFR
ALT: 24 IU/L (ref 0–32)
AST: 23 IU/L (ref 0–40)
Albumin: 4.5 g/dL (ref 3.8–4.9)
Alkaline Phosphatase: 96 IU/L (ref 49–135)
BUN/Creatinine Ratio: 27 — ABNORMAL HIGH (ref 9–23)
BUN: 20 mg/dL (ref 6–24)
Bilirubin Total: 0.3 mg/dL (ref 0.0–1.2)
CO2: 27 mmol/L (ref 20–29)
Calcium: 9.9 mg/dL (ref 8.7–10.2)
Chloride: 101 mmol/L (ref 96–106)
Creatinine, Ser: 0.75 mg/dL (ref 0.57–1.00)
Globulin, Total: 3.1 g/dL (ref 1.5–4.5)
Glucose: 96 mg/dL (ref 70–99)
Potassium: 4.5 mmol/L (ref 3.5–5.2)
Sodium: 140 mmol/L (ref 134–144)
Total Protein: 7.6 g/dL (ref 6.0–8.5)
eGFR: 92 mL/min/1.73 (ref >59)

## 2024-06-02 LAB — HEMOGLOBIN A1C
Est. average glucose Bld gHb Est-mCnc: 108 mg/dL
Hgb A1c MFr Bld: 5.4 % (ref 4.8–5.6)

## 2024-06-02 LAB — CBC
Hematocrit: 37.4 % (ref 34.0–46.6)
Hemoglobin: 12.2 g/dL (ref 11.1–15.9)
MCH: 28.5 pg (ref 26.6–33.0)
MCHC: 32.6 g/dL (ref 31.5–35.7)
MCV: 87 fL (ref 79–97)
Platelets: 239 x10E3/uL (ref 150–450)
RBC: 4.28 x10E6/uL (ref 3.77–5.28)
RDW: 13.9 % (ref 11.7–15.4)
WBC: 8.6 x10E3/uL (ref 3.4–10.8)

## 2024-06-02 LAB — LIPID PANEL
Chol/HDL Ratio: 3.5 ratio (ref 0.0–4.4)
Cholesterol, Total: 232 mg/dL — ABNORMAL HIGH (ref 100–199)
HDL: 66 mg/dL (ref >39)
LDL Chol Calc (NIH): 152 mg/dL — ABNORMAL HIGH (ref 0–99)
Triglycerides: 83 mg/dL (ref 0–149)
VLDL Cholesterol Cal: 14 mg/dL (ref 5–40)

## 2024-06-08 ENCOUNTER — Encounter: Payer: Self-pay | Admitting: Nurse Practitioner

## 2024-06-08 ENCOUNTER — Ambulatory Visit: Payer: Self-pay | Admitting: Nurse Practitioner

## 2024-06-08 DIAGNOSIS — Z6841 Body Mass Index (BMI) 40.0 and over, adult: Secondary | ICD-10-CM | POA: Insufficient documentation

## 2024-06-08 DIAGNOSIS — Z23 Encounter for immunization: Secondary | ICD-10-CM | POA: Insufficient documentation

## 2024-06-08 NOTE — Assessment & Plan Note (Addendum)
 Influenza vaccine administered Encouraged to take Tylenol as needed for fever or muscle aches.

## 2024-06-08 NOTE — Assessment & Plan Note (Addendum)
 Previous elevated cholesterol levels. Discussed dietary habits and fiber intake importance.

## 2024-06-08 NOTE — Assessment & Plan Note (Addendum)
 Class 3 obesity with recent weight gain. Limited exercise and high rice intake noted. Discussed post-menopausal weight loss challenges and dietary changes. Considered structured weight loss program and potential medication approval. - [facility] for [facility] weight loss program. - Advised dietary modifications, including reducing rice portion size and considering brown rice. - Checked A1c to assess for diabetes or prediabetes.

## 2024-06-08 NOTE — Assessment & Plan Note (Signed)
 Class 3 obesity with recent weight gain. Limited exercise and high rice intake noted. Discussed post-menopausal weight loss challenges and dietary changes. Considered structured weight loss program and potential medication approval. - [facility] for [facility] weight loss program. - Advised dietary modifications, including reducing rice portion size and considering brown rice. - Checked A1c to assess for diabetes or prediabetes.

## 2024-06-09 ENCOUNTER — Encounter (INDEPENDENT_AMBULATORY_CARE_PROVIDER_SITE_OTHER): Payer: Self-pay

## 2024-07-13 ENCOUNTER — Institutional Professional Consult (permissible substitution) (INDEPENDENT_AMBULATORY_CARE_PROVIDER_SITE_OTHER): Admitting: Nurse Practitioner

## 2024-08-17 ENCOUNTER — Encounter (INDEPENDENT_AMBULATORY_CARE_PROVIDER_SITE_OTHER): Payer: Self-pay | Admitting: Family Medicine

## 2024-08-17 ENCOUNTER — Ambulatory Visit (INDEPENDENT_AMBULATORY_CARE_PROVIDER_SITE_OTHER): Admitting: Family Medicine

## 2024-08-17 VITALS — BP 136/82 | HR 84 | Temp 98.2°F | Ht 66.0 in | Wt 275.0 lb

## 2024-08-17 DIAGNOSIS — Z6841 Body Mass Index (BMI) 40.0 and over, adult: Secondary | ICD-10-CM | POA: Diagnosis not present

## 2024-08-17 DIAGNOSIS — E782 Mixed hyperlipidemia: Secondary | ICD-10-CM | POA: Diagnosis not present

## 2024-08-17 DIAGNOSIS — Z0289 Encounter for other administrative examinations: Secondary | ICD-10-CM

## 2024-08-17 DIAGNOSIS — R739 Hyperglycemia, unspecified: Secondary | ICD-10-CM

## 2024-08-17 DIAGNOSIS — E669 Obesity, unspecified: Secondary | ICD-10-CM | POA: Diagnosis not present

## 2024-08-17 NOTE — Progress Notes (Signed)
 " Barnie DOROTHA Jenkins, DO, ABFM, ABOM Bariatric physician [address] [place], [place], [place] [address] Office: [phone]  /  Fax: [phone]     Initial Evaluation:  Lorraine Evans was seen in clinic today to evaluate for obesity. She is interested in losing weight to improve overall health and reduce the risk of weight related complications. She presents today to review program treatment options, initial physical assessment, and evaluation.      She was referred by: PCP  When asked what they hope to accomplish? She states: improve existing medical conditions, reduce number of medications, reduce risk of surgery, improve quality of life, and improve appearance.   When asked how has your weight affected you? She states: Contributed to medical problems, Contributed to orthopedic problems or mobility issues, Having fatigue, and Having poor endurance  Contributing factors to her weight change: consumption of processed foods  Some associated conditions: Arthritis:Knee, Hyperlipidemia, and Other: Vit D def, Fe Def (Anemia)   Current nutrition plan: None  Current level of physical activity: Walking 35 minutes, four  a week  Current or previous pharmacotherapy: Phentermine   Response to medication: Lost weight initially but was unable to sustain weight loss in 2021-2022 lost 5 lbs   Barriers to weight loss that patient expresses a concern about today: antiepileptics (gabapentin ) .     Past Medical History:  Diagnosis Date   Anemia    Elevated blood-pressure reading without diagnosis of hypertension 02/09/2019   H/O varicella    History of measles     Current Outpatient Medications  Medication Instructions   ascorbic acid (VITAMIN C) 500 mg, Daily   Cholecalciferol (VITAMIN D PO) 1 capsule, Daily   gabapentin  (NEURONTIN ) 100 mg, Oral, Daily, May titrate up to 300 mg as tolerated   Iron , Ferrous Sulfate , 325 (65 Fe) MG TABS 1 tablet, Oral, Daily   latanoprost (XALATAN) 0.005 %  ophthalmic solution SMARTSIG:In Eye(s)   meloxicam  (MOBIC ) 7.5 mg, Oral, Daily   Multiple Vitamin (MULTIVITAMIN WITH MINERALS) TABS tablet 1 tablet, Daily     Allergies[1]   Past Surgical History:  Procedure Laterality Date   CESAREAN SECTION     DILATION AND CURETTAGE OF UTERUS  1987   DILITATION & CURRETTAGE/HYSTROSCOPY WITH HYDROTHERMAL ABLATION N/A 12/03/2017   Procedure: DILATATION & CURETTAGE/HYSTEROSCOPY WITH HYDROTHERMAL ABLATION;  Surgeon: Timmie Norris, MD;  Location: [facility];  Service: Gynecology;  Laterality: N/A;   gallstones removal  1995     Family History  Problem Relation Age of Onset   Hypertension Mother      Objective:  BP 136/82   Pulse 84   Temp 98.2 F (36.8 C)   Ht 5' 6 (1.676 m)   Wt 275 lb (124.7 kg)   LMP  (LMP Unknown)   SpO2 97%   BMI 44.39 kg/m  She was weighed on the bioimpedance scale: Body mass index is 44.39 kg/m.  Visceral Fat rating : 18, Body Fat %:51.2  Weight Lost Since Last Visit: 0  Weight Gained Since Last Visit: 0    Vitals Temp: 98.2 F (36.8 C) BP: 136/82 Pulse Rate: 84 SpO2: 97 %   Anthropometric Measurements Height: 5' 6 (1.676 m) Weight: 275 lb (124.7 kg) BMI (Calculated): 44.41 Weight at Last Visit: 0 Weight Lost Since Last Visit: 0 Weight Gained Since Last Visit: 0 Starting Weight: 0 Total Weight Loss (lbs): 0 lb (0 kg) Peak Weight: 275lb Waist Measurement : 0 inches   Body Composition  Body Fat %: 51.2 %  Fat Mass (lbs): 141.2 lbs Muscle Mass (lbs): 127.8 lbs Total Body Water (lbs): 96 lbs Visceral Fat Rating : 18   Other Clinical Data Fasting: no Labs: no Today's Visit #: Consult     General: Well Developed, well nourished, and in no acute distress.  HEENT: Normocephalic, atraumatic; EOMI, sclerae are anicteric. Skin: Warm and dry, good turgor Chest:  Normal excursion, shape, no gross ABN Respiratory: No conversational dyspnea; speaking in full sentences NeuroM-Sk:  Normal gross ROM *  4 extremities  Psych: A and O *3, insight adequate, mood- full    Assessment and Plan:   FOR THE DISEASE OF OBESITY:  BMI 40.0-44.9, adult [facility]) Assessment & Plan: We reviewed anthropometrics, biometrics, associated medical conditions and contributing factors with patient. Lunell would benefit from a medically tailored reduced calorie nutrional plan based on their REE (resting energy expenditure), which will be determined by indirect calorimetry.  We will also assess for cardiometabolic risk and nutritional derangements via fasting labs at intake appointment.    Obesity Treatment / Action Plan:   she was weighed on the bioimpedance scale and results were discussed and documented in the synopsis.   Jerelene JULIANNA Skiff will complete provided nutritional and psychosocial assessment questionnaire before the next appointment.  she will be scheduled for indirect calorimetry to determine resting energy expenditure in a fasting state.  This will allow us  to create a reduced calorie, high-protein meal plan to promote loss of fat mass while preserving muscle mass.  We will also assess for cardiometabolic risk and nutritional derangements via an ECG and fasting serologies at her next appointment.  she was encouraged to work on amassing support from family and friends to begin their weight loss journey.   Work on eliminating or reducing the presence of highly processed, poorly nutritious, calorie-dense foods in the home.   Obesity Education Performed Today:  Patient was counseled on nutritional approaches to weight loss and benefits of reducing processed foods and consuming plant-based foods and high quality protein as part of nutritional weight management program.   We discussed the importance of long term lifestyle changes which include nutrition, exercise and behavioral modifications as well as the importance of customizing this to her specific health and social needs.   We discussed the  benefits of reaching a healthier weight to alleviate the symptoms of existing conditions and reduce the risks of the biomechanical, metabolic and psychological effects of obesity.  Was counseled on the health benefits of losing 5%-10% of total body weight.  Was counseled on our cognitive behavorial therapy program, lead by our bariatric psychologist, who focuses on emotional eating and creating positive behavorial change.  Was counseled on bariatric pharmacotherapy and how this may be used as an adjunct in their weight management    Shaela appears to be in the action stage of change and states they are ready to start intensive lifestyle modifications and behavioral modifications.  It was recommended that she follow up in the next 1-2 weeks to review the above steps, and to continue with treatment of their chronic disease state of obesity   FOR OTHER CONDITIONS RELATED TO THE DISEASE OF OBESITY:  Mixed hyperlipidemia Assessment & Plan Patient states that she has been diagnosed with HLD for several years. She denies being placed on any medications in the past. She states that she is diet and life style controlled. She reports that she has been borderline about being placed on statin medication but she changes her diet for the better. Her  last lipid panel on 06/01/24 shows an LDL of 152, HDL of 66 and Trig of 83. Patient denies having any personal or family history of heart attacks or strokes. She would benefit from a program like this one that focuses on a nutritional meal plan that is low on saturated and trans fat.    Hyperglycemia Assessment & Plan Patient denies being told that she has prediabetes or DM. She was previously told that she had high blood sugar levels. She denies being on any medication to help with high blood sugar levels.  Patient would benefit from a program like this one that focuses on a high protein nutritional meal plan and low simple carb and sugars.      Attestations:   LILLETTE Sonny Laroche, acting as a medical scribe for Barnie Jenkins, DO., have compiled all relevant documentation for today's office visit on behalf of Barnie Jenkins, DO, while in the presence of [employer], DO.  I have spent 48 minutes in the care of the patient today.  38 minutes was spent in face to face counseling of the patient on the disease of obesity and what our program can do for their medical conditions as well as in preventing future diseases. I discussed the importance of comprehensive care in the treatment of obesity including mental well being and physical activity. 10 minutes was spent on pre-chart review and additional post visit documentation.   I have reviewed the above documentation for accuracy and completeness, and I agree with the above. Barnie JINNY Jenkins, D.O.  The 21st Century Cures Act was signed into law in 2016 which includes the topic of electronic health records.  This provides immediate access to information in MyChart.  This includes consultation notes, operative notes, office notes, lab results and pathology reports.  If you have any questions about what you read please let us  know at your next visit so we can discuss your concerns and take corrective action if need be.  We are right here with you!    [1] No Known Allergies  "

## 2024-09-13 ENCOUNTER — Ambulatory Visit (INDEPENDENT_AMBULATORY_CARE_PROVIDER_SITE_OTHER): Admitting: Family Medicine

## 2024-09-14 ENCOUNTER — Institutional Professional Consult (permissible substitution) (INDEPENDENT_AMBULATORY_CARE_PROVIDER_SITE_OTHER): Admitting: Physician Assistant

## 2024-09-27 ENCOUNTER — Ambulatory Visit (INDEPENDENT_AMBULATORY_CARE_PROVIDER_SITE_OTHER): Admitting: Family Medicine

## 2024-10-21 ENCOUNTER — Encounter: Payer: Self-pay | Admitting: Nurse Practitioner
